# Patient Record
Sex: Male | Born: 1990 | Race: Black or African American | Hispanic: No | Marital: Single | Smoking: Never smoker
Health system: Southern US, Community
[De-identification: ages and names within clinical notes are randomized; demographics above are authoritative.]

## PROBLEM LIST (undated history)

## (undated) DIAGNOSIS — M1990 Unspecified osteoarthritis, unspecified site: Secondary | ICD-10-CM

## (undated) DIAGNOSIS — J45909 Unspecified asthma, uncomplicated: Secondary | ICD-10-CM

## (undated) DIAGNOSIS — S92309A Fracture of unspecified metatarsal bone(s), unspecified foot, initial encounter for closed fracture: Secondary | ICD-10-CM

## (undated) DIAGNOSIS — T8484XA Pain due to internal orthopedic prosthetic devices, implants and grafts, initial encounter: Secondary | ICD-10-CM

## (undated) HISTORY — PX: HERNIA REPAIR: SHX51

## (undated) HISTORY — PX: HAND SURGERY: SHX662

---

## 2012-10-16 DIAGNOSIS — S92309A Fracture of unspecified metatarsal bone(s), unspecified foot, initial encounter for closed fracture: Secondary | ICD-10-CM

## 2012-10-16 HISTORY — DX: Fracture of unspecified metatarsal bone(s), unspecified foot, initial encounter for closed fracture: S92.309A

## 2012-11-08 ENCOUNTER — Other Ambulatory Visit: Payer: Self-pay | Admitting: Orthopedic Surgery

## 2012-11-08 ENCOUNTER — Encounter (HOSPITAL_BASED_OUTPATIENT_CLINIC_OR_DEPARTMENT_OTHER): Payer: Self-pay | Admitting: *Deleted

## 2012-11-09 NOTE — H&P (Signed)
Ketan Veney is an 22 y.o. male.   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  The injury occurred this morning during drills.  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  The fracture is nondisplaced the little bit of lateral beaking.  He does not recall any pain in the metatarsal, prior to the inversion injury.  Past Medical History  Diagnosis Date  . Asthma     prn inhaler  . Metatarsal fracture 10/2012    left 5th    Past Surgical History  Procedure Laterality Date  . Hand surgery Right     boxer's fx.    History reviewed. No pertinent family history. Social History:  reports that he has never smoked. He has never used smokeless tobacco. He reports that he does not drink alcohol or use illicit drugs.  Allergies: No Known Allergies  No prescriptions prior to admission    No results found for this or any previous visit (from the past 48 hour(s)). No results found.  Review of Systems  Constitutional: Negative.   HENT: Negative.   Eyes: Negative.   Respiratory:       Hx of asthma  Cardiovascular: Negative.   Gastrointestinal: Negative.   Genitourinary: Negative.   Musculoskeletal: Negative.        Left foot pain  Skin: Negative.   Neurological: Negative.   Endo/Heme/Allergies: Negative.   Psychiatric/Behavioral: Negative.     Height 6\' 4"  (1.93 m), weight 130.636 kg (288 lb). Physical Exam  Constitutional: He is oriented to person, place, and time. He appears well-developed and well-nourished.  HENT:  Head: Normocephalic and atraumatic.  Eyes: Pupils are equal, round, and reactive to light.  Neck: Normal range of motion. Neck supple.  Respiratory: Effort normal and breath sounds normal.  Musculoskeletal: Normal range of motion.   Neurological: He is alert and oriented to person, place, and time. He has normal reflexes.  Skin: Skin is warm and dry.  Psychiatric: He has a normal mood and affect. His behavior is normal. Judgment and thought content normal.     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  Plan: The benefits of nonoperative versus operative treatment were discussed at length with the patient.  He would like to proceed with operative intervention to promote healing sooner than later.  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  He will talk to Agustin Cree today about getting surgery scheduled.  He has a Personal assistant and all see him back at the time of surgical intervention.  Ionia Schey R 11/09/2012, 5:12 PM

## 2012-11-12 ENCOUNTER — Ambulatory Visit (HOSPITAL_BASED_OUTPATIENT_CLINIC_OR_DEPARTMENT_OTHER)
Admission: RE | Admit: 2012-11-12 | Discharge: 2012-11-12 | Disposition: A | Discharge status: Home / Self Care | Payer: BC Managed Care – PPO | Source: Ambulatory Visit | Attending: Orthopedic Surgery | Admitting: Orthopedic Surgery

## 2012-11-12 ENCOUNTER — Encounter (HOSPITAL_BASED_OUTPATIENT_CLINIC_OR_DEPARTMENT_OTHER): Payer: Self-pay | Admitting: *Deleted

## 2012-11-12 ENCOUNTER — Ambulatory Visit (HOSPITAL_BASED_OUTPATIENT_CLINIC_OR_DEPARTMENT_OTHER): Payer: BC Managed Care – PPO | Admitting: Anesthesiology

## 2012-11-12 ENCOUNTER — Encounter (HOSPITAL_BASED_OUTPATIENT_CLINIC_OR_DEPARTMENT_OTHER): Payer: Self-pay | Admitting: Anesthesiology

## 2012-11-12 ENCOUNTER — Encounter (HOSPITAL_BASED_OUTPATIENT_CLINIC_OR_DEPARTMENT_OTHER)
Admission: RE | Disposition: A | Discharge status: Home / Self Care | Payer: Self-pay | Source: Ambulatory Visit | Attending: Orthopedic Surgery

## 2012-11-12 DIAGNOSIS — R011 Cardiac murmur, unspecified: Secondary | ICD-10-CM | POA: Insufficient documentation

## 2012-11-12 DIAGNOSIS — S92309A Fracture of unspecified metatarsal bone(s), unspecified foot, initial encounter for closed fracture: Secondary | ICD-10-CM | POA: Insufficient documentation

## 2012-11-12 DIAGNOSIS — E669 Obesity, unspecified: Secondary | ICD-10-CM | POA: Insufficient documentation

## 2012-11-12 DIAGNOSIS — Y998 Other external cause status: Secondary | ICD-10-CM | POA: Insufficient documentation

## 2012-11-12 DIAGNOSIS — Y929 Unspecified place or not applicable: Secondary | ICD-10-CM | POA: Insufficient documentation

## 2012-11-12 DIAGNOSIS — Y9361 Activity, american tackle football: Secondary | ICD-10-CM | POA: Insufficient documentation

## 2012-11-12 DIAGNOSIS — Z6835 Body mass index (BMI) 35.0-35.9, adult: Secondary | ICD-10-CM | POA: Insufficient documentation

## 2012-11-12 DIAGNOSIS — X58XXXA Exposure to other specified factors, initial encounter: Secondary | ICD-10-CM | POA: Insufficient documentation

## 2012-11-12 DIAGNOSIS — J45909 Unspecified asthma, uncomplicated: Secondary | ICD-10-CM | POA: Insufficient documentation

## 2012-11-12 DIAGNOSIS — S92302A Fracture of unspecified metatarsal bone(s), left foot, initial encounter for closed fracture: Secondary | ICD-10-CM

## 2012-11-12 HISTORY — DX: Fracture of unspecified metatarsal bone(s), unspecified foot, initial encounter for closed fracture: S92.309A

## 2012-11-12 HISTORY — DX: Unspecified asthma, uncomplicated: J45.909

## 2012-11-12 HISTORY — PX: ORIF TOE FRACTURE: SHX5032

## 2012-11-12 SURGERY — OPEN REDUCTION INTERNAL FIXATION (ORIF) METATARSAL (TOE) FRACTURE
Anesthesia: General | Site: Foot | Laterality: Left | Wound class: Clean

## 2012-11-12 MED ORDER — FENTANYL CITRATE 0.05 MG/ML IJ SOLN: 50.0000 ug | INTRAMUSCULAR | Status: DC | PRN

## 2012-11-12 MED ORDER — CHLORHEXIDINE GLUCONATE 4 % EX LIQD: 60.0000 mL | Freq: Once | CUTANEOUS | Status: DC

## 2012-11-12 MED ORDER — CEFAZOLIN SODIUM-DEXTROSE 2-3 GM-% IV SOLR: 2.0000 g | INTRAVENOUS | Status: DC

## 2012-11-12 MED ORDER — MIDAZOLAM HCL 5 MG/5ML IJ SOLN
INTRAMUSCULAR | Status: DC | PRN
  Administered 2012-11-12: 2 mg via INTRAVENOUS

## 2012-11-12 MED ORDER — OXYCODONE HCL 5 MG/5ML PO SOLN: 5.0000 mg | Freq: Once | ORAL | Status: AC | PRN

## 2012-11-12 MED ORDER — MIDAZOLAM HCL 2 MG/2ML IJ SOLN: 1.0000 mg | INTRAMUSCULAR | Status: DC | PRN

## 2012-11-12 MED ORDER — ONDANSETRON HCL 4 MG/2ML IJ SOLN
INTRAMUSCULAR | Status: DC | PRN
  Administered 2012-11-12: 4 mg via INTRAVENOUS

## 2012-11-12 MED ORDER — DEXAMETHASONE SODIUM PHOSPHATE 10 MG/ML IJ SOLN
INTRAMUSCULAR | Status: DC | PRN
  Administered 2012-11-12: 10 mg via INTRAVENOUS

## 2012-11-12 MED ORDER — MIDAZOLAM HCL 2 MG/2ML IJ SOLN: 0.5000 mg | Freq: Once | INTRAMUSCULAR | Status: DC | PRN

## 2012-11-12 MED ORDER — OXYCODONE HCL 5 MG PO TABS
5.0000 mg | ORAL_TABLET | Freq: Once | ORAL | Status: AC | PRN
  Administered 2012-11-12: 5 mg via ORAL

## 2012-11-12 MED ORDER — LIDOCAINE HCL (CARDIAC) 20 MG/ML IV SOLN
INTRAVENOUS | Status: DC | PRN
  Administered 2012-11-12: 40 mg via INTRAVENOUS

## 2012-11-12 MED ORDER — FENTANYL CITRATE 0.05 MG/ML IJ SOLN
INTRAMUSCULAR | Status: DC | PRN
  Administered 2012-11-12: 50 ug via INTRAVENOUS
  Administered 2012-11-12: 100 ug via INTRAVENOUS
  Administered 2012-11-12: 50 ug via INTRAVENOUS

## 2012-11-12 MED ORDER — KETOROLAC TROMETHAMINE 30 MG/ML IJ SOLN
INTRAMUSCULAR | Status: DC | PRN
  Administered 2012-11-12: 30 mg via INTRAVENOUS

## 2012-11-12 MED ORDER — PROPOFOL 10 MG/ML IV BOLUS
INTRAVENOUS | Status: DC | PRN
  Administered 2012-11-12: 300 mg via INTRAVENOUS

## 2012-11-12 MED ORDER — HYDROMORPHONE HCL PF 1 MG/ML IJ SOLN
0.2500 mg | INTRAMUSCULAR | Status: DC | PRN
  Administered 2012-11-12 (×4): 0.5 mg via INTRAVENOUS

## 2012-11-12 MED ORDER — BUPIVACAINE HCL (PF) 0.5 % IJ SOLN
INTRAMUSCULAR | Status: DC | PRN
  Administered 2012-11-12: 10 mL

## 2012-11-12 MED ORDER — MEPERIDINE HCL 25 MG/ML IJ SOLN: 6.2500 mg | INTRAMUSCULAR | Status: DC | PRN

## 2012-11-12 MED ORDER — PROMETHAZINE HCL 25 MG/ML IJ SOLN: 6.2500 mg | INTRAMUSCULAR | Status: DC | PRN

## 2012-11-12 MED ORDER — MIDAZOLAM HCL 2 MG/ML PO SYRP: 12.0000 mg | ORAL_SOLUTION | Freq: Once | ORAL | Status: DC | PRN

## 2012-11-12 MED ORDER — DEXTROSE-NACL 5-0.45 % IV SOLN: INTRAVENOUS | Status: DC

## 2012-11-12 MED ORDER — LACTATED RINGERS IV SOLN
INTRAVENOUS | Status: DC
  Administered 2012-11-12: 20 mL/h via INTRAVENOUS
  Administered 2012-11-12 (×2): via INTRAVENOUS

## 2012-11-12 SURGICAL SUPPLY — 59 items
BANDAGE ELASTIC 4 VELCRO ST LF (GAUZE/BANDAGES/DRESSINGS) ×2 IMPLANT
BIT DRILL CANN 3.2MM (BIT) ×1 IMPLANT
BLADE SURG 10 STRL SS (BLADE) ×2 IMPLANT
BLADE SURG 15 STRL LF DISP TIS (BLADE) IMPLANT
BLADE SURG 15 STRL SS (BLADE)
BNDG ESMARK 4X9 LF (GAUZE/BANDAGES/DRESSINGS) ×2 IMPLANT
CANISTER SUCTION 1200CC (MISCELLANEOUS) ×2 IMPLANT
CHLORAPREP W/TINT 26ML (MISCELLANEOUS) ×2 IMPLANT
COVER TABLE BACK 60X90 (DRAPES) ×2 IMPLANT
CUFF TOURNIQUET SINGLE 18IN (TOURNIQUET CUFF) IMPLANT
CUFF TOURNIQUET SINGLE 34IN LL (TOURNIQUET CUFF) IMPLANT
DECANTER SPIKE VIAL GLASS SM (MISCELLANEOUS) IMPLANT
DRAPE EXTREMITY T 121X128X90 (DRAPE) ×2 IMPLANT
DRAPE OEC MINIVIEW 54X84 (DRAPES) ×2 IMPLANT
DRAPE SURG 17X23 STRL (DRAPES) IMPLANT
DRAPE U 20/CS (DRAPES) ×2 IMPLANT
DRILL BIT CANN 3.2MM (BIT) ×1
DURA STEPPER LG (CAST SUPPLIES) IMPLANT
DURA STEPPER MED (CAST SUPPLIES) IMPLANT
DURA STEPPER SML (CAST SUPPLIES) IMPLANT
ELECT REM PT RETURN 9FT ADLT (ELECTROSURGICAL) ×2
ELECTRODE REM PT RTRN 9FT ADLT (ELECTROSURGICAL) ×1 IMPLANT
GAUZE SPONGE 4X4 16PLY XRAY LF (GAUZE/BANDAGES/DRESSINGS) IMPLANT
GAUZE XEROFORM 1X8 LF (GAUZE/BANDAGES/DRESSINGS) ×2 IMPLANT
GLOVE BIO SURGEON STRL SZ7 (GLOVE) IMPLANT
GLOVE BIO SURGEON STRL SZ7.5 (GLOVE) ×2 IMPLANT
GLOVE BIOGEL PI IND STRL 7.0 (GLOVE) IMPLANT
GLOVE BIOGEL PI IND STRL 8 (GLOVE) ×1 IMPLANT
GLOVE BIOGEL PI INDICATOR 7.0 (GLOVE)
GLOVE BIOGEL PI INDICATOR 8 (GLOVE) ×1
GOWN PREVENTION PLUS XLARGE (GOWN DISPOSABLE) ×4 IMPLANT
GUIDEWIRE THREADED 1.6 (WIRE) ×2 IMPLANT
NEEDLE HYPO 22GX1.5 SAFETY (NEEDLE) IMPLANT
NS IRRIG 1000ML POUR BTL (IV SOLUTION) IMPLANT
PACK BASIN DAY SURGERY FS (CUSTOM PROCEDURE TRAY) ×2 IMPLANT
PAD CAST 4YDX4 CTTN HI CHSV (CAST SUPPLIES) ×1 IMPLANT
PADDING CAST ABS 4INX4YD NS (CAST SUPPLIES) ×1
PADDING CAST ABS COTTON 4X4 ST (CAST SUPPLIES) ×1 IMPLANT
PADDING CAST COTTON 4X4 STRL (CAST SUPPLIES) ×1
PENCIL BUTTON HOLSTER BLD 10FT (ELECTRODE) ×2 IMPLANT
SCREW CANN 46MM (Screw) ×2 IMPLANT
SCREW CANN P THRD/60 4.5 (Screw) ×2 IMPLANT
SLEEVE SCD COMPRESS KNEE MED (MISCELLANEOUS) IMPLANT
SPLINT PLASTER CAST XFAST 3X15 (CAST SUPPLIES) IMPLANT
SPLINT PLASTER XTRA FASTSET 3X (CAST SUPPLIES)
SPONGE GAUZE 4X4 12PLY (GAUZE/BANDAGES/DRESSINGS) ×2 IMPLANT
SPONGE LAP 18X18 X RAY DECT (DISPOSABLE) IMPLANT
SUCTION FRAZIER TIP 10 FR DISP (SUCTIONS) ×2 IMPLANT
SUT ETHILON 3 0 PS 1 (SUTURE) ×2 IMPLANT
SUT ETHILON 4 0 PS 2 18 (SUTURE) IMPLANT
SUT TICRON 1 T 12 (SUTURE) IMPLANT
SUT VIC AB 2-0 SH 27 (SUTURE)
SUT VIC AB 2-0 SH 27XBRD (SUTURE) IMPLANT
SYR BULB 3OZ (MISCELLANEOUS) IMPLANT
SYR CONTROL 10ML LL (SYRINGE) IMPLANT
TOWEL OR 17X24 6PK STRL BLUE (TOWEL DISPOSABLE) ×2 IMPLANT
TUBE CONNECTING 20X1/4 (TUBING) ×2 IMPLANT
UNDERPAD 30X30 INCONTINENT (UNDERPADS AND DIAPERS) IMPLANT
YANKAUER SUCT BULB TIP NO VENT (SUCTIONS) IMPLANT

## 2012-11-12 NOTE — Interval H&P Note (Signed)
History and Physical Interval Note:  11/12/2012 11:36 AM  Jose Chambers  has presented today for surgery, with the diagnosis of LEFT 5TH METATARSAL FRACTURE   The various methods of treatment have been discussed with the patient and family. After consideration of risks, benefits and other options for treatment, the patient has consented to  Procedure(s): OPEN REDUCTION INTERNAL FIXATION (ORIF) LEFT 5TH METATARSAL (TOE) FRACTURE (Left) as a surgical intervention .  The patient's history has been reviewed, patient examined, no change in status, stable for surgery.  I have reviewed the patient's chart and labs.  Questions were answered to the patient's satisfaction.     Nestor Lewandowsky

## 2012-11-12 NOTE — Anesthesia Procedure Notes (Signed)
Procedure Name: LMA Insertion Date/Time: 11/12/2012 11:59 AM Performed by: Burna Cash Pre-anesthesia Checklist: Patient identified, Emergency Drugs available, Suction available and Patient being monitored Patient Re-evaluated:Patient Re-evaluated prior to inductionOxygen Delivery Method: Circle System Utilized Preoxygenation: Pre-oxygenation with 100% oxygen Intubation Type: IV induction Ventilation: Mask ventilation without difficulty LMA: LMA inserted LMA Size: 5.0 Number of attempts: 1 Airway Equipment and Method: bite block Placement Confirmation: positive ETCO2 Tube secured with: Tape Dental Injury: Teeth and Oropharynx as per pre-operative assessment

## 2012-11-12 NOTE — Anesthesia Preprocedure Evaluation (Addendum)
Anesthesia Evaluation  Patient identified by MRN, date of birth, ID band Patient awake    Reviewed: Allergy & Precautions, H&P , NPO status , Patient's Chart, lab work & pertinent test results  History of Anesthesia Complications Negative for: history of anesthetic complications  Airway Mallampati: I TM Distance: >3 FB Neck ROM: Full    Dental  (+) Teeth Intact and Dental Advisory Given   Pulmonary asthma ,  breath sounds clear to auscultation  Pulmonary exam normal       Cardiovascular negative cardio ROS  + Valvular Problems/Murmurs Rhythm:Regular Rate:Normal     Neuro/Psych negative neurological ROS     GI/Hepatic negative GI ROS, Neg liver ROS,   Endo/Other  Morbid obesity  Renal/GU negative Renal ROS     Musculoskeletal   Abdominal (+) + obese,   Peds  Hematology   Anesthesia Other Findings   Reproductive/Obstetrics                          Anesthesia Physical Anesthesia Plan  ASA: II  Anesthesia Plan: General   Post-op Pain Management:    Induction: Intravenous  Airway Management Planned: LMA  Additional Equipment:   Intra-op Plan:   Post-operative Plan:   Informed Consent: I have reviewed the patients History and Physical, chart, labs and discussed the procedure including the risks, benefits and alternatives for the proposed anesthesia with the patient or authorized representative who has indicated his/her understanding and acceptance.   Dental advisory given  Plan Discussed with: CRNA and Surgeon  Anesthesia Plan Comments: (Patient declines regional, plan routine monitors, GA- LMA OK)        Anesthesia Quick Evaluation

## 2012-11-12 NOTE — Transfer of Care (Signed)
Immediate Anesthesia Transfer of Care Note  Patient: Jose Chambers  Procedure(s) Performed: Procedure(s): OPEN REDUCTION INTERNAL FIXATION (ORIF) LEFT 5TH METATARSAL (TOE) FRACTURE (Left)  Patient Location: PACU  Anesthesia Type:General  Level of Consciousness: sedated  Airway & Oxygen Therapy: Patient Spontanous Breathing and Patient connected to face mask oxygen  Post-op Assessment: Report given to PACU RN and Post -op Vital signs reviewed and stable  Post vital signs: Reviewed and stable  Complications: No apparent anesthesia complications

## 2012-11-12 NOTE — Anesthesia Postprocedure Evaluation (Signed)
  Anesthesia Post-op Note  Patient: Jose Chambers  Procedure(s) Performed: Procedure(s): OPEN REDUCTION INTERNAL FIXATION (ORIF) LEFT 5TH METATARSAL (TOE) FRACTURE (Left)  Patient Location: PACU  Anesthesia Type:General  Level of Consciousness: awake, alert , oriented and patient cooperative  Airway and Oxygen Therapy: Patient Spontanous Breathing  Post-op Pain: mild  Post-op Assessment: Post-op Vital signs reviewed, Patient's Cardiovascular Status Stable, Respiratory Function Stable, Patent Airway, No signs of Nausea or vomiting and Pain level controlled  Post-op Vital Signs: Reviewed and stable  Complications: No apparent anesthesia complications

## 2012-11-12 NOTE — Op Note (Signed)
Pre Op Dx: Left fifth metatarsal Jones fracture  Post Op Dx: Same  Procedure: Percutaneous open reduction internal fixation of left fifth Jones fracture with a 4.5 mm x 60 mm cannulated screw  Surgeon: Nestor Lewandowsky, MD  Assistant: Dannielle Burn PA-C present for the entire case, and needed for positioning retraction and operation of the C-arm.  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 The fracture and is in the watershed zone and will benefit from compressive screw fixation appear  Procedure: Patient identified by armband, received preoperative antibiotics, and taken [facility] where the appropriate anesthetic monitors were attached and general LMA anesthesia induced with the patient in the supine position. Left lower [place] was prepped and draped in usual sterile fashion from the toes to the midcalf and a timeout procedure performed. Using a #15 blade a longitudinal incision was made 1/2 cm proximal to the head of the fifth metatarsal laterally and going posteriorly for 1 cm. A guide pin was then inserted under C-arm imaging control into the base of the fifth metatarsal and driven through the fracture site or some resistance was felt consistent with an acute on chronic stress fracture. The guide pin was then taken down the shaft of the fifth metatarsal to the distal metaphyseal flare and overreamed of the it with the 3.2 mm reamer from the cannulated screw set. We then placed in the tap along the guide pin and measured for a 60 mm x 4.5 mm cannulated screw which was inserted over the guidewire without difficulty with good firm fixation. Final C-arm images were then captured and printed. The  wound irrigated out normal saline solution and closed with 3-0 nylon suture. Dressing of Xerofoam, 4 x 4 dressing sponges, web roll Ace wrap and a Cam Walker boot was reapplied. He will be weightbearing as tolerated until I see him back in the office in approximately one week.

## 2012-11-13 ENCOUNTER — Encounter (HOSPITAL_BASED_OUTPATIENT_CLINIC_OR_DEPARTMENT_OTHER): Payer: Self-pay | Admitting: Orthopedic Surgery

## 2013-08-16 ENCOUNTER — Other Ambulatory Visit: Payer: Self-pay | Admitting: Orthopedic Surgery

## 2013-08-16 DIAGNOSIS — M79672 Pain in left foot: Secondary | ICD-10-CM

## 2013-08-19 ENCOUNTER — Ambulatory Visit
Admission: RE | Admit: 2013-08-19 | Discharge: 2013-08-19 | Disposition: A | Discharge status: Home / Self Care | Payer: BC Managed Care – PPO | Source: Ambulatory Visit | Attending: Orthopedic Surgery | Admitting: Orthopedic Surgery

## 2013-08-19 DIAGNOSIS — M79672 Pain in left foot: Secondary | ICD-10-CM

## 2013-12-24 ENCOUNTER — Other Ambulatory Visit: Payer: Self-pay | Admitting: Family Medicine

## 2013-12-24 ENCOUNTER — Ambulatory Visit
Admission: RE | Admit: 2013-12-24 | Discharge: 2013-12-24 | Disposition: A | Discharge status: Home / Self Care | Payer: BC Managed Care – PPO | Source: Ambulatory Visit | Attending: Family Medicine | Admitting: Family Medicine

## 2013-12-24 DIAGNOSIS — M79672 Pain in left foot: Secondary | ICD-10-CM

## 2014-05-07 ENCOUNTER — Other Ambulatory Visit: Payer: Self-pay | Admitting: Orthopedic Surgery

## 2014-05-07 DIAGNOSIS — M79672 Pain in left foot: Secondary | ICD-10-CM

## 2014-05-09 ENCOUNTER — Other Ambulatory Visit: Payer: No Typology Code available for payment source

## 2014-05-12 ENCOUNTER — Ambulatory Visit
Admission: RE | Admit: 2014-05-12 | Discharge: 2014-05-12 | Disposition: A | Discharge status: Home / Self Care | Payer: BLUE CROSS/BLUE SHIELD | Source: Ambulatory Visit | Attending: Orthopedic Surgery | Admitting: Orthopedic Surgery

## 2014-05-12 DIAGNOSIS — M79672 Pain in left foot: Secondary | ICD-10-CM

## 2014-05-20 ENCOUNTER — Other Ambulatory Visit: Payer: Self-pay | Admitting: Orthopedic Surgery

## 2014-05-21 ENCOUNTER — Encounter (HOSPITAL_BASED_OUTPATIENT_CLINIC_OR_DEPARTMENT_OTHER): Payer: Self-pay | Admitting: *Deleted

## 2014-05-27 NOTE — H&P (Signed)
Jose Chambers is an 24 y.o. male.    Chief Complaint:  Right Foot Pain  HPI: Patient returns today with continued pain in the left fifth metatarsal where he had a reinjury bent a 4.5 mm cannulated screw and developed and a crack in the bone.  A CT scan is been accomplished showing some early bridging bone.  He still has pain with every step however.  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  The fracture was initially fixed in July of 2014 and he did well until a reinjury in March of 2015.  We have been using a bone stimulator.  Past Medical History  Diagnosis Date  . Asthma     prn inhaler  . Metatarsal fracture 10/2012    left 5th  . Painful orthopaedic hardware     lt foot    Past Surgical History  Procedure Laterality Date  . Hand surgery Right     boxer's fx.  . Orif toe fracture Left 11/12/2012    Procedure: OPEN REDUCTION INTERNAL FIXATION (ORIF) LEFT 5TH METATARSAL (TOE) FRACTURE;  Surgeon: Nestor LewandowskyFrank J Rowan, MD;  Location: [facility];  Service: Orthopedics;  Laterality: Left;    History reviewed. No pertinent family history. Social History:  reports that he has never smoked. He has never used smokeless tobacco. He reports that he drinks alcohol. He reports that he does not use illicit drugs.  Allergies: No Known Allergies  No prescriptions prior to admission    No results found for this or any previous visit (from the past 48 hour(s)). No results found.  Review of Systems  Constitutional: Negative.   HENT: Negative.   Eyes: Negative.   Cardiovascular: Negative.   Gastrointestinal: Negative.   Genitourinary: Negative.   Skin: Negative.   Neurological: Negative.   Endo/Heme/Allergies: Negative.   Psychiatric/Behavioral: Negative.   All other systems reviewed and are negative.   Height 6\' 4"  (1.93 m), weight 129.275 kg (285 lb). Physical Exam  Constitutional: He is oriented to person, place, and time. He appears well-developed and  well-nourished.  HENT:  Head: Normocephalic and atraumatic.  Eyes: Pupils are equal, round, and reactive to light.  Neck: Normal range of motion. Neck supple.  Cardiovascular: Intact distal pulses.   Respiratory: Effort normal.  GI: Soft. Bowel sounds are normal.  Musculoskeletal: He exhibits tenderness.  Little if any objective swelling over the left fifth metatarsal.  He is neurovascularly intact there is no erythema, inflammation or sign of infection.    Neurological: He is alert and oriented to person, place, and time.  Skin: Skin is warm and dry.  Psychiatric: He has a normal mood and affect. His behavior is normal. Judgment and thought content normal.     Assessment/Plan Assess: The patient has a minimal amount of bridging bone across the refracture site and there is a bent 4.5 mm cannulated screw in place.  Plan: After discussing options as well as risks and benefits, we will proceed with removal of the bent screw.  Reaming of the bone with application of a 6.5 mm screw and local bone grafting to the fracture site.  He will be in a boot for a few months after surgery and then should be able to resume training.  We will get this arranged for him sooner than later.  Jose Chambers R 05/27/2014, 8:42 AM

## 2014-05-28 ENCOUNTER — Encounter (HOSPITAL_BASED_OUTPATIENT_CLINIC_OR_DEPARTMENT_OTHER)
Admission: RE | Disposition: A | Discharge status: Home / Self Care | Payer: Self-pay | Source: Ambulatory Visit | Attending: Orthopedic Surgery

## 2014-05-28 ENCOUNTER — Ambulatory Visit (HOSPITAL_BASED_OUTPATIENT_CLINIC_OR_DEPARTMENT_OTHER): Payer: BLUE CROSS/BLUE SHIELD | Admitting: Certified Registered"

## 2014-05-28 ENCOUNTER — Encounter (HOSPITAL_BASED_OUTPATIENT_CLINIC_OR_DEPARTMENT_OTHER): Payer: Self-pay | Admitting: Certified Registered"

## 2014-05-28 ENCOUNTER — Ambulatory Visit (HOSPITAL_BASED_OUTPATIENT_CLINIC_OR_DEPARTMENT_OTHER)
Admission: RE | Admit: 2014-05-28 | Discharge: 2014-05-28 | Disposition: A | Discharge status: Home / Self Care | Payer: BLUE CROSS/BLUE SHIELD | Source: Ambulatory Visit | Attending: Orthopedic Surgery | Admitting: Orthopedic Surgery

## 2014-05-28 DIAGNOSIS — S92302A Fracture of unspecified metatarsal bone(s), left foot, initial encounter for closed fracture: Secondary | ICD-10-CM

## 2014-05-28 DIAGNOSIS — Y838 Other surgical procedures as the cause of abnormal reaction of the patient, or of later complication, without mention of misadventure at the time of the procedure: Secondary | ICD-10-CM | POA: Insufficient documentation

## 2014-05-28 DIAGNOSIS — F1099 Alcohol use, unspecified with unspecified alcohol-induced disorder: Secondary | ICD-10-CM | POA: Diagnosis not present

## 2014-05-28 DIAGNOSIS — J45909 Unspecified asthma, uncomplicated: Secondary | ICD-10-CM | POA: Insufficient documentation

## 2014-05-28 DIAGNOSIS — S92352K Displaced fracture of fifth metatarsal bone, left foot, subsequent encounter for fracture with nonunion: Secondary | ICD-10-CM | POA: Diagnosis not present

## 2014-05-28 DIAGNOSIS — E669 Obesity, unspecified: Secondary | ICD-10-CM | POA: Diagnosis not present

## 2014-05-28 DIAGNOSIS — Z6835 Body mass index (BMI) 35.0-35.9, adult: Secondary | ICD-10-CM | POA: Insufficient documentation

## 2014-05-28 DIAGNOSIS — G8918 Other acute postprocedural pain: Secondary | ICD-10-CM | POA: Insufficient documentation

## 2014-05-28 HISTORY — PX: HARDWARE REMOVAL: SHX979

## 2014-05-28 HISTORY — PX: ORIF TOE FRACTURE: SHX5032

## 2014-05-28 HISTORY — DX: Pain due to internal orthopedic prosthetic devices, implants and grafts, initial encounter: T84.84XA

## 2014-05-28 LAB — POCT HEMOGLOBIN-HEMACUE: Hemoglobin: 15.9 g/dL (ref 13.0–17.0)

## 2014-05-28 SURGERY — REMOVAL, HARDWARE
Anesthesia: Regional | Site: Foot | Laterality: Left

## 2014-05-28 MED ORDER — DEXTROSE 5 % IV SOLN
3.0000 g | INTRAVENOUS | Status: AC
  Administered 2014-05-28: 3 g via INTRAVENOUS

## 2014-05-28 MED ORDER — METOCLOPRAMIDE HCL 5 MG/ML IJ SOLN: 5.0000 mg | Freq: Three times a day (TID) | INTRAMUSCULAR | Status: DC | PRN

## 2014-05-28 MED ORDER — CHLORHEXIDINE GLUCONATE 4 % EX LIQD
60.0000 mL | Freq: Once | CUTANEOUS | Status: DC
Start: 2014-05-28 — End: 2014-05-28

## 2014-05-28 MED ORDER — PROPOFOL 10 MG/ML IV BOLUS
INTRAVENOUS | Status: DC | PRN
  Administered 2014-05-28: 250 mg via INTRAVENOUS

## 2014-05-28 MED ORDER — BUPIVACAINE-EPINEPHRINE (PF) 0.5% -1:200000 IJ SOLN
INTRAMUSCULAR | Status: DC | PRN
  Administered 2014-05-28: 30 mL via PERINEURAL

## 2014-05-28 MED ORDER — DEXTROSE-NACL 5-0.45 % IV SOLN: INTRAVENOUS | Status: DC

## 2014-05-28 MED ORDER — FENTANYL CITRATE 0.05 MG/ML IJ SOLN
INTRAMUSCULAR | Status: AC
  Filled 2014-05-28: qty 6

## 2014-05-28 MED ORDER — ONDANSETRON HCL 4 MG PO TABS: 4.0000 mg | ORAL_TABLET | Freq: Four times a day (QID) | ORAL | Status: DC | PRN

## 2014-05-28 MED ORDER — MIDAZOLAM HCL 2 MG/2ML IJ SOLN
INTRAMUSCULAR | Status: AC
  Filled 2014-05-28: qty 2

## 2014-05-28 MED ORDER — ONDANSETRON HCL 4 MG/2ML IJ SOLN: 4.0000 mg | Freq: Four times a day (QID) | INTRAMUSCULAR | Status: DC | PRN

## 2014-05-28 MED ORDER — DEXAMETHASONE SODIUM PHOSPHATE 10 MG/ML IJ SOLN
INTRAMUSCULAR | Status: DC | PRN
  Administered 2014-05-28: 10 mg via INTRAVENOUS

## 2014-05-28 MED ORDER — LIDOCAINE HCL (CARDIAC) 20 MG/ML IV SOLN
INTRAVENOUS | Status: DC | PRN
  Administered 2014-05-28: 30 mg via INTRAVENOUS

## 2014-05-28 MED ORDER — OXYCODONE HCL 5 MG PO TABS: 5.0000 mg | ORAL_TABLET | Freq: Once | ORAL | Status: DC | PRN

## 2014-05-28 MED ORDER — FENTANYL CITRATE 0.05 MG/ML IJ SOLN
50.0000 ug | INTRAMUSCULAR | Status: DC | PRN
  Administered 2014-05-28: 100 ug via INTRAVENOUS

## 2014-05-28 MED ORDER — FENTANYL CITRATE 0.05 MG/ML IJ SOLN
INTRAMUSCULAR | Status: AC
  Filled 2014-05-28: qty 2

## 2014-05-28 MED ORDER — MIDAZOLAM HCL 2 MG/2ML IJ SOLN
1.0000 mg | INTRAMUSCULAR | Status: DC | PRN
  Administered 2014-05-28: 2 mg via INTRAVENOUS

## 2014-05-28 MED ORDER — METOCLOPRAMIDE HCL 5 MG PO TABS: 5.0000 mg | ORAL_TABLET | Freq: Three times a day (TID) | ORAL | Status: DC | PRN

## 2014-05-28 MED ORDER — OXYCODONE HCL 5 MG/5ML PO SOLN: 5.0000 mg | Freq: Once | ORAL | Status: DC | PRN

## 2014-05-28 MED ORDER — ONDANSETRON HCL 4 MG/2ML IJ SOLN
INTRAMUSCULAR | Status: DC | PRN
Start: 2014-05-28 — End: 2014-05-28
  Administered 2014-05-28: 4 mg via INTRAVENOUS

## 2014-05-28 MED ORDER — LACTATED RINGERS IV SOLN
INTRAVENOUS | Status: DC
  Administered 2014-05-28: 11:00:00 via INTRAVENOUS

## 2014-05-28 MED ORDER — HYDROCODONE-ACETAMINOPHEN 5-325 MG PO TABS: 1.0000 | ORAL_TABLET | Freq: Four times a day (QID) | ORAL | Status: DC | PRN

## 2014-05-28 MED ORDER — MIDAZOLAM HCL 5 MG/5ML IJ SOLN
INTRAMUSCULAR | Status: DC | PRN
  Administered 2014-05-28: 2 mg via INTRAVENOUS

## 2014-05-28 MED ORDER — CEFAZOLIN SODIUM 1-5 GM-% IV SOLN
INTRAVENOUS | Status: AC
  Filled 2014-05-28: qty 50

## 2014-05-28 MED ORDER — HYDROMORPHONE HCL 1 MG/ML IJ SOLN: 0.2500 mg | INTRAMUSCULAR | Status: DC | PRN

## 2014-05-28 MED ORDER — BUPIVACAINE HCL (PF) 0.5 % IJ SOLN
INTRAMUSCULAR | Status: AC
  Filled 2014-05-28: qty 30

## 2014-05-28 MED ORDER — CEFAZOLIN SODIUM-DEXTROSE 2-3 GM-% IV SOLR
INTRAVENOUS | Status: AC
  Filled 2014-05-28: qty 50

## 2014-05-28 SURGICAL SUPPLY — 78 items
BANDAGE ELASTIC 4 VELCRO ST LF (GAUZE/BANDAGES/DRESSINGS) ×6 IMPLANT
BANDAGE ELASTIC 6 VELCRO ST LF (GAUZE/BANDAGES/DRESSINGS) IMPLANT
BANDAGE ESMARK 6X9 LF (GAUZE/BANDAGES/DRESSINGS) IMPLANT
BLADE SURG 10 STRL SS (BLADE) ×3 IMPLANT
BLADE SURG 15 STRL LF DISP TIS (BLADE) ×2 IMPLANT
BLADE SURG 15 STRL SS (BLADE) ×4
BNDG ESMARK 4X9 LF (GAUZE/BANDAGES/DRESSINGS) ×3 IMPLANT
BNDG ESMARK 6X9 LF (GAUZE/BANDAGES/DRESSINGS)
BOOT STEPPER DURA LG (SOFTGOODS) IMPLANT
BOOT STEPPER DURA MED (SOFTGOODS) IMPLANT
BOOT STEPPER DURA SM (SOFTGOODS) IMPLANT
BOOT STEPPER DURA XLG (SOFTGOODS) ×3 IMPLANT
CANISTER SUCT 1200ML W/VALVE (MISCELLANEOUS) ×3 IMPLANT
COVER BACK TABLE 60X90IN (DRAPES) ×3 IMPLANT
CUFF TOURNIQUET SINGLE 18IN (TOURNIQUET CUFF) IMPLANT
CUFF TOURNIQUET SINGLE 34IN LL (TOURNIQUET CUFF) IMPLANT
DECANTER SPIKE VIAL GLASS SM (MISCELLANEOUS) IMPLANT
DRAPE EXTREMITY T 121X128X90 (DRAPE) ×3 IMPLANT
DRAPE OEC MINIVIEW 54X84 (DRAPES) ×3 IMPLANT
DRAPE SURG 17X23 STRL (DRAPES) ×3 IMPLANT
DRAPE U 20/CS (DRAPES) ×3 IMPLANT
DRAPE U-SHAPE 47X51 STRL (DRAPES) ×3 IMPLANT
DRAPE U-SHAPE 76X120 STRL (DRAPES) IMPLANT
DURAPREP 26ML APPLICATOR (WOUND CARE) ×3 IMPLANT
ELECT REM PT RETURN 9FT ADLT (ELECTROSURGICAL) ×3
ELECTRODE REM PT RTRN 9FT ADLT (ELECTROSURGICAL) ×1 IMPLANT
GAUZE SPONGE 4X4 12PLY STRL (GAUZE/BANDAGES/DRESSINGS) ×3 IMPLANT
GAUZE SPONGE 4X4 16PLY XRAY LF (GAUZE/BANDAGES/DRESSINGS) IMPLANT
GAUZE XEROFORM 1X8 LF (GAUZE/BANDAGES/DRESSINGS) ×3 IMPLANT
GLOVE BIO SURGEON STRL SZ 6.5 (GLOVE) ×2 IMPLANT
GLOVE BIO SURGEON STRL SZ7.5 (GLOVE) ×3 IMPLANT
GLOVE BIO SURGEON STRL SZ8.5 (GLOVE) ×3 IMPLANT
GLOVE BIO SURGEONS STRL SZ 6.5 (GLOVE) ×1
GLOVE BIOGEL PI IND STRL 7.0 (GLOVE) ×2 IMPLANT
GLOVE BIOGEL PI IND STRL 8 (GLOVE) ×1 IMPLANT
GLOVE BIOGEL PI IND STRL 9 (GLOVE) ×1 IMPLANT
GLOVE BIOGEL PI INDICATOR 7.0 (GLOVE) ×4
GLOVE BIOGEL PI INDICATOR 8 (GLOVE) ×2
GLOVE BIOGEL PI INDICATOR 9 (GLOVE) ×2
GOWN STRL REUS W/ TWL LRG LVL3 (GOWN DISPOSABLE) ×1 IMPLANT
GOWN STRL REUS W/ TWL XL LVL3 (GOWN DISPOSABLE) ×1 IMPLANT
GOWN STRL REUS W/TWL LRG LVL3 (GOWN DISPOSABLE) ×2
GOWN STRL REUS W/TWL XL LVL3 (GOWN DISPOSABLE) ×2
GUIDEWIRE THREADED 1.6 (WIRE) ×3 IMPLANT
GUIDEWIRE THREADED 2.8 (WIRE) ×3 IMPLANT
NEEDLE HYPO 22GX1.5 SAFETY (NEEDLE) IMPLANT
NS IRRIG 1000ML POUR BTL (IV SOLUTION) ×3 IMPLANT
PACK BASIN DAY SURGERY FS (CUSTOM PROCEDURE TRAY) ×3 IMPLANT
PAD CAST 4YDX4 CTTN HI CHSV (CAST SUPPLIES) ×1 IMPLANT
PADDING CAST ABS 4INX4YD NS (CAST SUPPLIES) ×2
PADDING CAST ABS COTTON 4X4 ST (CAST SUPPLIES) ×1 IMPLANT
PADDING CAST COTTON 4X4 STRL (CAST SUPPLIES) ×2
PADDING CAST COTTON 6X4 STRL (CAST SUPPLIES) IMPLANT
PENCIL BUTTON HOLSTER BLD 10FT (ELECTRODE) ×3 IMPLANT
SCREW CANN TI 16 THRD 6.5X50 (Screw) ×3 IMPLANT
SLEEVE SCD COMPRESS KNEE MED (MISCELLANEOUS) IMPLANT
SPLINT PLASTER CAST XFAST 3X15 (CAST SUPPLIES) IMPLANT
SPLINT PLASTER XTRA FASTSET 3X (CAST SUPPLIES)
SPONGE LAP 18X18 X RAY DECT (DISPOSABLE) IMPLANT
SPONGE LAP 4X18 X RAY DECT (DISPOSABLE) IMPLANT
STAPLER VISISTAT (STAPLE) IMPLANT
STAPLER VISISTAT 35W (STAPLE) IMPLANT
SUCTION FRAZIER TIP 10 FR DISP (SUCTIONS) ×3 IMPLANT
SUT ETHILON 3 0 PS 1 (SUTURE) IMPLANT
SUT ETHILON 4 0 PS 2 18 (SUTURE) ×3 IMPLANT
SUT MNCRL AB 3-0 PS2 18 (SUTURE) IMPLANT
SUT TICRON 1 T 12 (SUTURE) IMPLANT
SUT VIC AB 2-0 SH 27 (SUTURE)
SUT VIC AB 2-0 SH 27XBRD (SUTURE) IMPLANT
SUT VIC AB 3-0 SH 27 (SUTURE)
SUT VIC AB 3-0 SH 27X BRD (SUTURE) IMPLANT
SYR BULB 3OZ (MISCELLANEOUS) ×3 IMPLANT
SYR CONTROL 10ML LL (SYRINGE) IMPLANT
TOWEL OR 17X24 6PK STRL BLUE (TOWEL DISPOSABLE) ×3 IMPLANT
TUBE CONNECTING 20'X1/4 (TUBING) ×1
TUBE CONNECTING 20X1/4 (TUBING) ×2 IMPLANT
UNDERPAD 30X30 INCONTINENT (UNDERPADS AND DIAPERS) ×3 IMPLANT
YANKAUER SUCT BULB TIP NO VENT (SUCTIONS) IMPLANT

## 2014-05-28 NOTE — Anesthesia Preprocedure Evaluation (Addendum)
Anesthesia Evaluation  Patient identified by MRN, date of birth, ID band Patient awake    Reviewed: Allergy & Precautions, NPO status , Patient's Chart, lab work & pertinent test results  Airway Mallampati: II   Neck ROM: full    Dental   Pulmonary asthma ,          Cardiovascular negative cardio ROS      Neuro/Psych    GI/Hepatic   Endo/Other  obese  Renal/GU      Musculoskeletal   Abdominal   Peds  Hematology   Anesthesia Other Findings   Reproductive/Obstetrics                            Anesthesia Physical Anesthesia Plan  ASA: II  Anesthesia Plan: General and Regional   Post-op Pain Management: MAC Combined w/ Regional for Post-op pain   Induction: Intravenous  Airway Management Planned: LMA  Additional Equipment:   Intra-op Plan:   Post-operative Plan:   Informed Consent: I have reviewed the patients History and Physical, chart, labs and discussed the procedure including the risks, benefits and alternatives for the proposed anesthesia with the patient or authorized representative who has indicated his/her understanding and acceptance.     Plan Discussed with: CRNA, Anesthesiologist and Surgeon  Anesthesia Plan Comments:        Anesthesia Quick Evaluation

## 2014-05-28 NOTE — Anesthesia Procedure Notes (Addendum)
Anesthesia Regional Block:  Popliteal block  Pre-Anesthetic Checklist: ,, timeout performed, Correct Patient, Correct Site, Correct Laterality, Correct Procedure, Correct Position, site marked, Risks and benefits discussed,  Surgical consent,  Pre-op evaluation,  At surgeon's request and post-op pain management  Laterality: Left  Prep: chloraprep       Needles:  Injection technique: Single-shot  Needle Type: Echogenic Stimulator Needle     Needle Length: 9cm 9 cm Needle Gauge: 21 and 21 G    Additional Needles:  Procedures: ultrasound guided (picture in chart) and nerve stimulator Popliteal block  Nerve Stimulator or Paresthesia:  Response: plantar flexion of foot, 0.45 mA,   Additional Responses:   Narrative:  Start time: 05/28/2014 11:07 AM End time: 05/28/2014 11:17 AM Injection made incrementally with aspirations every 5 mL.  Performed by: Personally  Anesthesiologist: HODIERNE, ADAM  Additional Notes: Functioning IV was confirmed and monitors were applied.  A 90mm 21ga Arrow echogenic stimulator needle was used. Sterile prep and drape,hand hygiene and sterile gloves were used.  Negative aspiration and negative test dose prior to incremental administration of local anesthetic. The patient tolerated the procedure well.  Ultrasound guidance: relevent anatomy identified, needle position confirmed, local anesthetic spread visualized around nerve(s), vascular puncture avoided.  Image printed for medical record.    Procedure Name: LMA Insertion Date/Time: 05/28/2014 11:58 AM Performed by: Nicloe Frontera Pre-anesthesia Checklist: Patient identified, Emergency Drugs available, Suction available and Patient being monitored Patient Re-evaluated:Patient Re-evaluated prior to inductionOxygen Delivery Method: Circle System Utilized Preoxygenation: Pre-oxygenation with 100% oxygen Intubation Type: IV induction Ventilation: Mask ventilation without difficulty LMA: LMA  inserted LMA Size: 5.0 Number of attempts: 1 Airway Equipment and Method: Bite block Placement Confirmation: positive ETCO2 Tube secured with: Tape Dental Injury: Teeth and Oropharynx as per pre-operative assessment

## 2014-05-28 NOTE — Transfer of Care (Signed)
Immediate Anesthesia Transfer of Care Note  Patient: Jose Chambers  Procedure(s) Performed: Procedure(s): LEFT FOOT REMOVAL OF 4.5 MM CANNULATED SCREW AND REPLACE WITH 6.5 MM/SMALL SCREW (Left) OPEN REDUCTION INTERNAL FIXATION (ORIF) METATARSAL (TOE) FRACTURE (Left)  Patient Location: PACU  Anesthesia Type:GA combined with regional for post-op pain  Level of Consciousness: awake, alert , oriented and patient cooperative  Airway & Oxygen Therapy: Patient Spontanous Breathing and Patient connected to face mask oxygen  Post-op Assessment: Report given to RN and Post -op Vital signs reviewed and stable  Post vital signs: Reviewed and stable  Last Vitals:  Filed Vitals:   05/28/14 1255  BP:   Pulse: 66  Temp:   Resp: 20    Complications: No apparent anesthesia complications

## 2014-05-28 NOTE — Interval H&P Note (Signed)
History and Physical Interval Note:  05/28/2014 11:44 AM  Jose Chambers  has presented today for surgery, with the diagnosis of left fifth metatarsal fracture/nonunion  The various methods of treatment have been discussed with the patient and family. After consideration of risks, benefits and other options for treatment, the patient has consented to  Procedure(s): LEFT FOOT REMOVAL OF 4.5 MM CANNULATED SCREW AND REPLACE WITH 6.5 MM/SMALL SCREW (Left) OPEN REDUCTION INTERNAL FIXATION (ORIF) METATARSAL (TOE) FRACTURE (Left) as a surgical intervention .  The patient's history has been reviewed, patient examined, no change in status, stable for surgery.  I have reviewed the patient's chart and labs.  Questions were answered to the patient's satisfaction.     Nestor LewandowskyOWAN,Rhett Najera J

## 2014-05-28 NOTE — Anesthesia Postprocedure Evaluation (Signed)
Anesthesia Post Note  Patient: Jose Chambers  Procedure(s) Performed: Procedure(s) (LRB): LEFT FOOT REMOVAL OF 4.5 MM CANNULATED SCREW AND REPLACE WITH 6.5 MM/SMALL SCREW (Left) OPEN REDUCTION INTERNAL FIXATION (ORIF) METATARSAL (TOE) FRACTURE (Left)  Anesthesia type: General  Patient location: PACU  Post pain: Pain level controlled and Adequate analgesia  Post assessment: Post-op Vital signs reviewed, Patient's Cardiovascular Status Stable, Respiratory Function Stable, Patent Airway and Pain level controlled  Last Vitals:  Filed Vitals:   05/28/14 1418  BP:   Pulse: 70  Temp:   Resp: 17    Post vital signs: Reviewed and stable  Level of consciousness: awake, alert  and oriented  Complications: No apparent anesthesia complications

## 2014-05-28 NOTE — Op Note (Signed)
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  Post Op Dx: Same  Procedure: Removal of band 4.5 mm x 60 mm cannulated screw from left fifth metatarsal recurrent Jones fracture and exchanged for a 6.5 mm x 50 mm partially threaded cannulated screw.  Surgeon: Nestor LewandowskyFrank J Linville Decarolis, MD  Assistant: Tomi LikensEric K. Gaylene BrooksPhillips PA-C  (present throughout entire procedure and necessary for timely completion of the procedure)  Anesthesia: General  EBL: Minimal  Fluids: Cc  Tourniquet Time: None  Indications: 24 year old [place] [employer] who first fractured his left fifth metatarsal at the metaphyseal diaphyseal junction in the summer of 2014 he underwent open reduction internal fixation in July 2014 when on the heel and did well until a reinjury that occurred in March 2015. He refractured and actually bent the screw. We attempted to treat him conservatively with immobilization and a bone stimulator and he did attain partial union by CT scan by January 2016 but still has some discomfort. Because of this he is taken for removal the 4.5 mm screw and exchanged for a 6.5 mm cannulated screw with reaming to effectively bone graft of the fracture site.  Procedure: Patient was identified by arm band and receive preoperative IV antibiotics. He was taken to operating room one at the day surgery center appropriate segment monitors were attached and general LMA anesthesia induced with the patient in the supine position. Tourniquet was applied to the left calf the left lower extremity prepped and draped in usual sterile fashion from the toes to the tourniquet. Timeout procedure was performed. We began the operation by re-creating the old 8 mm incision proximal to the fifth metatarsal and we were able to pass a guide pin through the 4.5 mm cannulated screw and then remove it with the cannulated screwdriver. We then placed a new guide pin from the 6.5 mm screw set into the canal of  the fifth metatarsal and overreamed with the 5 mm reamer. We then measured for a 50 mm partially threaded screw and drove it over the guidepin until we obtain good firm fixation on the proximal fifth metatarsal. Final C-arm images were taken the wound was irrigated out normal saline solution and closed with 3-0 nylon suture. A dressing of Xerofoam 4 x 4 dressing sponges web roll and Ace wrap applied. He was placed in a Cam Walker boot for postoperative immobilization. We will see him back in the office in approximately one week.

## 2014-05-28 NOTE — Progress Notes (Signed)
Assisted Dr. Hodierne with left, ultrasound guided, popliteal block. Side rails up, monitors on throughout procedure. See vital signs in flow sheet. Tolerated Procedure well. 

## 2014-05-28 NOTE — Discharge Instructions (Signed)
Regional Anesthesia Blocks ° °1. Numbness or the inability to move the "blocked" extremity may last from 3-48 hours after placement. The length of time depends on the medication injected and your individual response to the medication. If the numbness is not going away after 48 hours, call your surgeon. ° °2. The extremity that is blocked will need to be protected until the numbness is gone and the  Strength has returned. Because you cannot feel it, you will need to take extra care to avoid injury. Because it may be weak, you may have difficulty moving it or using it. You may not know what position it is in without looking at it while the block is in effect. ° °3. For blocks in the legs and feet, returning to weight bearing and walking needs to be done carefully. You will need to wait until the numbness is entirely gone and the strength has returned. You should be able to move your leg and foot normally before you try and bear weight or walk. You will need someone to be with you when you first try to ensure you do not fall and possibly risk injury. ° °4. Bruising and tenderness at the needle site are common side effects and will resolve in a few days. ° °5. Persistent numbness or new problems with movement should be communicated to the surgeon or the [facility] ([phone])/ [facility] ([phone]). ° ° ° °Post Anesthesia Home Care Instructions ° °Activity: °Get plenty of rest for the remainder of the day. A responsible adult should stay with you for 24 hours following the procedure.  °For the next 24 hours, DO NOT: °-Drive a car °-Operate machinery °-Drink alcoholic beverages °-Take any medication unless instructed by your physician °-Make any legal decisions or sign important papers. ° °Meals: °Start with liquid foods such as gelatin or soup. Progress to regular foods as tolerated. Avoid greasy, spicy, heavy foods. If nausea and/or vomiting occur, drink only clear liquids until the  nausea and/or vomiting subsides. Call your physician if vomiting continues. ° °Special Instructions/Symptoms: °Your throat may feel dry or sore from the anesthesia or the breathing tube placed in your throat during surgery. If this causes discomfort, gargle with warm salt water. The discomfort should disappear within 24 hours. ° °

## 2014-05-29 ENCOUNTER — Encounter (HOSPITAL_BASED_OUTPATIENT_CLINIC_OR_DEPARTMENT_OTHER): Payer: Self-pay | Admitting: Orthopedic Surgery

## 2016-04-18 HISTORY — PX: ROTATOR CUFF REPAIR: SHX139

## 2018-05-18 ENCOUNTER — Ambulatory Visit
Admission: RE | Admit: 2018-05-18 | Discharge: 2018-05-18 | Disposition: A | Discharge status: Home / Self Care | Payer: Self-pay | Source: Ambulatory Visit | Attending: Nurse Practitioner | Admitting: Nurse Practitioner

## 2018-05-18 ENCOUNTER — Other Ambulatory Visit: Payer: Self-pay | Admitting: Nurse Practitioner

## 2018-05-18 DIAGNOSIS — Z021 Encounter for pre-employment examination: Secondary | ICD-10-CM

## 2018-08-13 ENCOUNTER — Emergency Department (HOSPITAL_COMMUNITY)
Admission: EM | Admit: 2018-08-13 | Discharge: 2018-08-13 | Disposition: A | Discharge status: Home / Self Care | Payer: 59 | Attending: Emergency Medicine | Admitting: Emergency Medicine

## 2018-08-13 ENCOUNTER — Other Ambulatory Visit: Payer: Self-pay

## 2018-08-13 ENCOUNTER — Encounter (HOSPITAL_COMMUNITY): Payer: Self-pay | Admitting: Emergency Medicine

## 2018-08-13 DIAGNOSIS — J4520 Mild intermittent asthma, uncomplicated: Secondary | ICD-10-CM | POA: Diagnosis not present

## 2018-08-13 DIAGNOSIS — R0602 Shortness of breath: Secondary | ICD-10-CM | POA: Diagnosis present

## 2018-08-13 MED ORDER — PREDNISONE 20 MG PO TABS: 60.0000 mg | ORAL_TABLET | Freq: Once | ORAL | Status: DC

## 2018-08-13 MED ORDER — DEXAMETHASONE SODIUM PHOSPHATE 10 MG/ML IJ SOLN
10.0000 mg | Freq: Once | INTRAMUSCULAR | Status: DC
  Filled 2018-08-13: qty 1

## 2018-08-13 MED ORDER — ALBUTEROL SULFATE HFA 108 (90 BASE) MCG/ACT IN AERS
8.0000 | INHALATION_SPRAY | Freq: Once | RESPIRATORY_TRACT | Status: AC
  Administered 2018-08-13: 8 via RESPIRATORY_TRACT
  Filled 2018-08-13: qty 6.7

## 2018-08-13 MED ORDER — DEXAMETHASONE 4 MG PO TABS
10.0000 mg | ORAL_TABLET | Freq: Once | ORAL | Status: AC
  Administered 2018-08-13: 10 mg via ORAL
  Filled 2018-08-13: qty 3

## 2018-08-13 MED ORDER — AEROCHAMBER PLUS FLO-VU LARGE MISC
Status: AC
  Filled 2018-08-13: qty 1

## 2018-08-13 MED ORDER — AEROCHAMBER PLUS FLO-VU LARGE MISC
1.0000 | Freq: Once | Status: AC
  Administered 2018-08-13: 1

## 2018-08-13 NOTE — ED Triage Notes (Signed)
Pt reports he woke up this morning and felt as if his asthma was bad.  He states his chest feels heavy and he feels SOB just like a asthma attack.  "feels like I can't take a full breath."

## 2018-08-13 NOTE — ED Notes (Signed)
Pt stats he understands instructions and homed stable with steady gait.

## 2018-08-13 NOTE — ED Provider Notes (Signed)
[facility] EMERGENCY DEPARTMENT Provider Note   CSN: [id number] Arrival date & time: 08/13/18  4696    History   Chief Complaint Chief Complaint  Patient presents with  . Asthma    HPI Jose Chambers is a 28 y.o. male w PMhx asthma, seasonal allergies, presenting to the ED with complaint of shortness of breath that began upon waking up this morning.  Patient states he felt his chest was tight and felt difficulty breathing.  He states this feels like his typical asthma related symptoms.  He had associated wheezing.  He did not have his albuterol inhaler at home, and therefore had to wait until he arrived at work to use his albuterol inhaler.  He used it at work and then again on the way here with gradual improvement.  Patient states currently his symptoms are nearly resolved, however has some heaviness and some mild pain with inhaling.  He states the pain is very typical for when his asthma is acting up, is not any worse or different.  He endorses symptoms of seasonal allergies including sneezing, runny nose, itchy eyes.  He is not treating his allergies.  He does not have a PCP.  He denies fever, infectious symptoms.  No contact with COVID positive people.      The history is provided by the patient.    Past Medical History:  Diagnosis Date  . Asthma    prn inhaler  . Metatarsal fracture 10/2012   left 5th  . Painful orthopaedic hardware [facility])    lt foot    There are no active problems to display for this patient.   Past Surgical History:  Procedure Laterality Date  . HAND SURGERY Right    boxer's fx.  Marland Kitchen HARDWARE REMOVAL Left 05/28/2014   Procedure: LEFT FOOT REMOVAL OF 4.5 MM CANNULATED SCREW AND REPLACE WITH 6.5 MM/SMALL SCREW;  Surgeon: Nestor Lewandowsky, MD;  Location: [facility];  Service: Orthopedics;  Laterality: Left;  . ORIF TOE FRACTURE Left 11/12/2012   Procedure: OPEN REDUCTION INTERNAL FIXATION (ORIF) LEFT 5TH METATARSAL (TOE) FRACTURE;   Surgeon: Nestor Lewandowsky, MD;  Location: [facility];  Service: Orthopedics;  Laterality: Left;  . ORIF TOE FRACTURE Left 05/28/2014   Procedure: OPEN REDUCTION INTERNAL FIXATION (ORIF) METATARSAL (TOE) FRACTURE;  Surgeon: Nestor Lewandowsky, MD;  Location: [facility];  Service: Orthopedics;  Laterality: Left;        Home Medications    Prior to Admission medications   Medication Sig Start Date End Date Taking? Authorizing Provider  albuterol (PROVENTIL HFA;VENTOLIN HFA) 108 (90 BASE) MCG/ACT inhaler Inhale into the lungs every 6 (six) hours as needed for wheezing or shortness of breath.    [provider]  HYDROcodone-acetaminophen (NORCO) 5-325 MG per tablet Take 1 tablet by mouth every 6 (six) hours as needed. 05/28/14   Allena Katz, PA-C    Family History No family history on file.  Social History Social History   Tobacco Use  . Smoking status: Never Smoker  . Smokeless tobacco: Never Used  Substance Use Topics  . Alcohol use: Yes    Comment: social  . Drug use: No     Allergies   Patient has no known allergies.   Review of Systems Review of Systems  Constitutional: Negative for fever.  HENT: Positive for rhinorrhea and sneezing.   Respiratory: Positive for chest tightness and shortness of breath.   All other systems reviewed and  are negative.    Physical Exam Updated Vital Signs BP (!) 145/92 (BP Location: Right Arm)   Pulse 84   Temp 97.8 F (36.6 C) (Oral)   Resp (!) 26   Ht 6\' 3"  (1.905 m)   Wt (!) 154.2 kg   SpO2 96%   BMI 42.50 kg/m   Physical Exam Vitals signs and nursing note reviewed.  Constitutional:      General: He is not in acute distress.    Appearance: He is well-developed. He is obese. He is not ill-appearing.  HENT:     Head: Normocephalic and atraumatic.     Mouth/Throat:     Mouth: Mucous membranes are moist.     Pharynx: Oropharynx is clear.  Eyes:     Conjunctiva/sclera: Conjunctivae  normal.  Neck:     Musculoskeletal: Normal range of motion and neck supple.  Cardiovascular:     Rate and Rhythm: Normal rate and regular rhythm.  Pulmonary:     Effort: Pulmonary effort is normal. No respiratory distress.     Breath sounds: Normal breath sounds. No stridor. No wheezing, rhonchi or rales.     Comments: Speaking in full sentences Abdominal:     Palpations: Abdomen is soft.  Skin:    General: Skin is warm.  Neurological:     Mental Status: He is alert.  Psychiatric:        Behavior: Behavior normal.      ED Treatments / Results  Labs (all labs ordered are listed, but only abnormal results are displayed) Labs Reviewed - No data to display  EKG None  Radiology No results found.  Procedures Procedures (including critical care time)  Medications Ordered in ED Medications  albuterol (VENTOLIN HFA) 108 (90 Base) MCG/ACT inhaler 8 puff (has no administration in time range)  AeroChamber Plus Flo-Vu Large MISC 1 each (has no administration in time range)  dexamethasone (DECADRON) injection 10 mg (has no administration in time range)     Initial Impression / Assessment and Plan / ED Course  I have reviewed the triage vital signs and the nursing notes.  Pertinent labs & imaging results that were available during my care of the patient were reviewed by me and considered in my medical decision making (see chart for details).        Patient presenting with an episode of shortness of breath and chest tightness that began upon awakening this morning.  History of asthma, feels related to asthma, per patient.  Patient treated with albuterol x2 prior to arrival and feels near his baseline upon evaluation.  He states his chest feels still somewhat heavy, however he is no longer short of breath or wheezing.  On exam, vital signs are stable, normal work of breathing.  Speaking in full sentences.  Lungs are clear bilaterally. O2 sat >95% on RA. ENT exam is unremarkable.   Treated with albuterol and a dose of Decadron.  Pt breathing at baseline prior to discharge. Do not believe patient is currently having asthma exacerbation given reassuring exam and improvement with home use of albuterol.  Encourage patient begin using antihistamines for allergy symptoms.  Encourage he establish primary care.  Return precautions discussed.  Safe for discharge.  Discussed results, findings, treatment and follow up. Patient advised of return precautions. Patient verbalized understanding and agreed with plan.   Final Clinical Impressions(s) / ED Diagnoses   Final diagnoses:  Mild intermittent asthma, unspecified whether complicated    ED Discharge Orders  None       Akshay Spang, [place] N, [place] 08/13/18 6270    Maia Plan, MD 08/13/18 2025

## 2018-08-13 NOTE — ED Notes (Signed)
ED Provider at bedside. 

## 2018-08-13 NOTE — Discharge Instructions (Signed)
Please read instructions below. Use your albuterol inhaler every 4-6 hours as needed for shortness of breath or wheezing.  It is recommended that you establish a primary care provider. Return to the ER if you have shortness of breath not improved by your inhaler, or new or concerning symptoms.

## 2019-04-12 ENCOUNTER — Emergency Department (HOSPITAL_COMMUNITY)
Admission: EM | Admit: 2019-04-12 | Discharge: 2019-04-12 | Disposition: A | Discharge status: Home / Self Care | Payer: 59 | Attending: Emergency Medicine | Admitting: Emergency Medicine

## 2019-04-12 ENCOUNTER — Encounter (HOSPITAL_COMMUNITY): Payer: Self-pay | Admitting: Emergency Medicine

## 2019-04-12 ENCOUNTER — Other Ambulatory Visit: Payer: Self-pay

## 2019-04-12 ENCOUNTER — Emergency Department (HOSPITAL_COMMUNITY): Payer: 59

## 2019-04-12 DIAGNOSIS — J4521 Mild intermittent asthma with (acute) exacerbation: Secondary | ICD-10-CM | POA: Insufficient documentation

## 2019-04-12 DIAGNOSIS — R0602 Shortness of breath: Secondary | ICD-10-CM | POA: Diagnosis present

## 2019-04-12 MED ORDER — PREDNISONE 20 MG PO TABS: 40.0000 mg | ORAL_TABLET | Freq: Every day | ORAL | 0 refills | Status: DC

## 2019-04-12 MED ORDER — ALBUTEROL SULFATE HFA 108 (90 BASE) MCG/ACT IN AERS
2.0000 | INHALATION_SPRAY | Freq: Once | RESPIRATORY_TRACT | Status: AC
  Administered 2019-04-12: 2 via RESPIRATORY_TRACT
  Filled 2019-04-12: qty 6.7

## 2019-04-12 NOTE — ED Triage Notes (Signed)
Patient reports SOB with occasional dry cough onset Saturday , denies fever or chills .

## 2019-04-12 NOTE — ED Provider Notes (Signed)
[facility] Provider [facility]   CSN: [id number] Arrival date & time: 04/12/19  0025     History Chief Complaint  Patient presents with  . Shortness of Breath    Jose Chambers is a 28 y.o. male with a hx of asthma presents to the Emergency Department complaining of gradual, persistent, progressively worsening SOB onset 11:30pm. Pt reports he felt as if he couldn't take a deep breath.  Pt reports he was evaluated by EMS but was not given any medications.  Pt reports this felt like the beginning of his asthma exacerbation.  He did not have his inhaler at that time. Pt reports he was given an inhaler in triage which helped significantly.  Pt reports URI symptoms last week and was tested for COVID 1 week ago with a negative result.  He has been taking OTC meds for this.  He does report associated dry cough for the last week.  Pt denies fever, chills, headache, neck pain, chest pain, abd pain, N/V/D, weakness, dizziness. Pt reports he has asthma exacerbations during the winter.     The history is provided by the patient and medical records. No language interpreter was used.       Past Medical History:  Diagnosis Date  . Asthma    prn inhaler  . Metatarsal fracture 10/2012   left 5th  . Painful orthopaedic hardware [facility])    lt foot    There are no problems to display for this patient.   Past Surgical History:  Procedure Laterality Date  . HAND SURGERY Right    boxer's fx.  Marland Kitchen. HARDWARE REMOVAL Left 05/28/2014   Procedure: LEFT FOOT REMOVAL OF 4.5 MM CANNULATED SCREW AND REPLACE WITH 6.5 MM/SMALL SCREW;  Surgeon: Nestor LewandowskyFrank J Rowan, MD;  Location: [facility];  Service: Orthopedics;  Laterality: Left;  . ORIF TOE FRACTURE Left 11/12/2012   Procedure: OPEN REDUCTION INTERNAL FIXATION (ORIF) LEFT 5TH METATARSAL (TOE) FRACTURE;  Surgeon: Nestor LewandowskyFrank J Rowan, MD;  Location: [facility];  Service: Orthopedics;  Laterality: Left;  . ORIF TOE  FRACTURE Left 05/28/2014   Procedure: OPEN REDUCTION INTERNAL FIXATION (ORIF) METATARSAL (TOE) FRACTURE;  Surgeon: Nestor LewandowskyFrank J Rowan, MD;  Location: [facility];  Service: Orthopedics;  Laterality: Left;       No family history on file.  Social History   Tobacco Use  . Smoking status: Never Smoker  . Smokeless tobacco: Never Used  Substance Use Topics  . Alcohol use: Yes    Comment: social  . Drug use: No    Home Medications Prior to Admission medications   Medication Sig Start Date End Date Taking? Authorizing Provider  albuterol (PROVENTIL HFA;VENTOLIN HFA) 108 (90 BASE) MCG/ACT inhaler Inhale 1-2 puffs into the lungs every 6 (six) hours as needed for wheezing or shortness of breath.    Yes [provider]  HYDROcodone-acetaminophen (NORCO) 5-325 MG per tablet Take 1 tablet by mouth every 6 (six) hours as needed. Patient not taking: Reported on 04/12/2019 05/28/14   Allena KatzPhillips, Eric K, PA-C  predniSONE (DELTASONE) 20 MG tablet Take 2 tablets (40 mg total) by mouth daily. 04/12/19   Deseri Loss, Dahlia ClientHannah, PA-C    Allergies    Patient has no known allergies.  Review of Systems   Review of Systems  Constitutional: Negative for appetite change, chills, fatigue and fever.  HENT: Negative for congestion, ear discharge, ear pain, mouth sores, postnasal drip, rhinorrhea, sinus pressure and sore throat.  Eyes: Negative for visual disturbance.  Respiratory: Positive for cough, chest tightness and shortness of breath. Negative for wheezing and stridor.   Cardiovascular: Negative for chest pain, palpitations and leg swelling.  Gastrointestinal: Negative for abdominal pain, diarrhea, nausea and vomiting.  Genitourinary: Negative for dysuria, frequency, hematuria and urgency.  Musculoskeletal: Negative for arthralgias, back pain, myalgias and neck stiffness.  Skin: Negative for rash.  Neurological: Negative for syncope, light-headedness, numbness and headaches.    Hematological: Negative for adenopathy.  Psychiatric/Behavioral: The patient is not nervous/anxious.   All other systems reviewed and are negative.   Physical Exam Updated Vital Signs BP 140/87 (BP Location: Left Arm)   Pulse 89   Temp 98.3 F (36.8 C)   Resp 18   SpO2 96%   Physical Exam Vitals and nursing note reviewed.  Constitutional:      General: He is not in acute distress.    Appearance: He is not diaphoretic.  HENT:     Head: Normocephalic.  Eyes:     General: No scleral icterus.    Conjunctiva/sclera: Conjunctivae normal.  Cardiovascular:     Rate and Rhythm: Normal rate and regular rhythm.     Pulses: Normal pulses.          Radial pulses are 2+ on the right side and 2+ on the left side.  Pulmonary:     Effort: Pulmonary effort is normal. No tachypnea, accessory muscle usage, prolonged expiration, respiratory distress or retractions.     Breath sounds: Normal breath sounds. No stridor. No decreased breath sounds, wheezing, rhonchi or rales.     Comments: Equal chest rise. No increased work of breathing. Abdominal:     General: There is no distension.     Palpations: Abdomen is soft.     Tenderness: There is no abdominal tenderness. There is no guarding or rebound.  Musculoskeletal:     Cervical back: Normal range of motion.     Comments: Moves all extremities equally and without difficulty.  Skin:    General: Skin is warm and dry.     Capillary Refill: Capillary refill takes less than 2 seconds.  Neurological:     Mental Status: He is alert.     GCS: GCS eye subscore is 4. GCS verbal subscore is 5. GCS motor subscore is 6.     Comments: Speech is clear and goal oriented.  Psychiatric:        Mood and Affect: Mood normal.     ED Results / Procedures / Treatments   Labs (all labs ordered are listed, but only abnormal results are displayed) Labs Reviewed - No data to display  EKG EKG Interpretation  Date/Time:  Friday April 12 2019 00:39:03  EST Ventricular Rate:  74 PR Interval:  166 QRS Duration: 100 QT Interval:  368 QTC Calculation: 408 R Axis:   64 Text Interpretation: Normal sinus rhythm with sinus arrhythmia T wave abnormality, consider inferior ischemia Abnormal ECG When compared with ECG of 08/13/2018, T wave inversion Inferior leads is more pronounced Confirmed by Delora Fuel ([id number]) on 04/12/2019 3:02:02 AM   Radiology DG Chest 2 View  Result Date: 04/12/2019 CLINICAL DATA:  Shortness of breath EXAM: CHEST - 2 VIEW COMPARISON:  May 18, 2018 FINDINGS: The heart size and mediastinal contours are within normal limits. Both lungs are clear. The visualized skeletal structures are unremarkable. IMPRESSION: No active cardiopulmonary disease. Electronically Signed   By: Prudencio Pair M.D.   On: 04/12/2019 01:28    Procedures  Procedures (including critical care time)  Medications Ordered in ED Medications  albuterol (VENTOLIN HFA) 108 (90 Base) MCG/ACT inhaler 2 puff (2 puffs Inhalation Given 04/12/19 0046)    ED Course  I have reviewed the triage vital signs and the nursing notes.  Pertinent labs & imaging results that were available during my care of the patient were reviewed by me and considered in my medical decision making (see chart for details).    MDM Rules/Calculators/A&P                      Lowry Montoya was evaluated in Emergency Department on 04/12/2019 for the symptoms described in the history of present illness. He was evaluated in the context of the global COVID-19 pandemic, which necessitated consideration that the patient might be at risk for infection with the SARS-CoV-2 virus that causes COVID-19. Institutional protocols and algorithms that pertain to the evaluation of patients at risk for COVID-19 are in a state of rapid change based on information released by regulatory bodies including the CDC and federal and state organizations. These policies and algorithms were followed during the patient's  care in the ED.  Patient presents to the emergency department with dry cough and shortness of breath.  Symptoms similar to previous asthma exacerbations.  Recent URI and weather change.  Patient did not have his albuterol inhaler with him.  On arrival here to the emergency department he was given albuterol MDI with significant improvement.  Clear and equal breath sounds on my exam.  No hypoxia or tachycardia.  Negative Covid test 1 week ago and no worsening symptoms.  No fevers or chills.  No respiratory distress.  No sensory muscle usage.  Less likely to be Covid at this time and more likely to be asthma exacerbation.  Will give short burst of prednisone.  Discussed close follow-up with primary care and reasons to return to the emergency department. Final Clinical Impression(s) / ED Diagnoses Final diagnoses:  Mild intermittent asthma with exacerbation    Rx / DC Orders ED Discharge Orders         Ordered    predniSONE (DELTASONE) 20 MG tablet  Daily     04/12/19 0424           Eulogia Dismore, Boyd Kerbs 04/12/19 0427    Ward, Layla Maw, DO 04/12/19 [phone]

## 2019-04-12 NOTE — ED Notes (Signed)
Pt felt fine walking from waiting room to room 34. No pain, no SOB

## 2019-04-12 NOTE — Discharge Instructions (Addendum)
1. Medications: albuterol, prednisone, usual home medications °2. Treatment: rest, drink plenty of fluids, begin OTC antihistamine (Zyrtec or Claritin)  °3. Follow Up: Please followup with your primary doctor in 2-3 days for discussion of your diagnoses and further evaluation after today's visit; if you do not have a primary care doctor use the resource guide provided to find one; Please return to the ER for difficulty breathing, high fevers or worsening symptoms. ° °

## 2019-11-28 ENCOUNTER — Other Ambulatory Visit (HOSPITAL_COMMUNITY): Payer: Self-pay | Admitting: Internal Medicine

## 2019-11-28 DIAGNOSIS — J45909 Unspecified asthma, uncomplicated: Secondary | ICD-10-CM

## 2019-12-16 ENCOUNTER — Other Ambulatory Visit (HOSPITAL_COMMUNITY)
Admission: RE | Admit: 2019-12-16 | Discharge: 2019-12-16 | Disposition: A | Discharge status: Home / Self Care | Payer: 59 | Source: Ambulatory Visit | Attending: Neurological Surgery | Admitting: Neurological Surgery

## 2019-12-16 DIAGNOSIS — Z20822 Contact with and (suspected) exposure to covid-19: Secondary | ICD-10-CM | POA: Diagnosis not present

## 2019-12-16 DIAGNOSIS — Z01812 Encounter for preprocedural laboratory examination: Secondary | ICD-10-CM | POA: Insufficient documentation

## 2019-12-16 LAB — SARS CORONAVIRUS 2 (TAT 6-24 HRS): SARS Coronavirus 2: NEGATIVE

## 2019-12-18 ENCOUNTER — Ambulatory Visit (HOSPITAL_COMMUNITY)
Admission: RE | Admit: 2019-12-18 | Discharge: 2019-12-18 | Disposition: A | Discharge status: Home / Self Care | Payer: 59 | Source: Ambulatory Visit | Attending: Internal Medicine | Admitting: Internal Medicine

## 2019-12-18 ENCOUNTER — Other Ambulatory Visit: Payer: Self-pay

## 2019-12-18 DIAGNOSIS — J45909 Unspecified asthma, uncomplicated: Secondary | ICD-10-CM | POA: Diagnosis present

## 2019-12-18 LAB — PULMONARY FUNCTION TEST
DL/VA % pred: 114 %
DL/VA: 5.64 ml/min/mmHg/L
DLCO unc % pred: 91 %
DLCO unc: 35.08 ml/min/mmHg
FEF 25-75 Post: 3.89 L/sec
FEF 25-75 Pre: 4.12 L/sec
FEF2575-%Change-Post: -5 %
FEF2575-%Pred-Post: 80 %
FEF2575-%Pred-Pre: 85 %
FEV1-%Change-Post: 2 %
FEV1-%Pred-Post: 84 %
FEV1-%Pred-Pre: 83 %
FEV1-Post: 3.85 L
FEV1-Pre: 3.77 L
FEV1FVC-%Change-Post: 0 %
FEV1FVC-%Pred-Pre: 98 %
FEV6-%Change-Post: 3 %
FEV6-%Pred-Post: 86 %
FEV6-%Pred-Pre: 84 %
FEV6-Post: 4.65 L
FEV6-Pre: 4.5 L
FEV6FVC-%Change-Post: 0 %
FEV6FVC-%Pred-Post: 101 %
FEV6FVC-%Pred-Pre: 100 %
FVC-%Change-Post: 2 %
FVC-%Pred-Post: 85 %
FVC-%Pred-Pre: 83 %
FVC-Post: 4.66 L
FVC-Pre: 4.53 L
Post FEV1/FVC ratio: 83 %
Post FEV6/FVC ratio: 100 %
Pre FEV1/FVC ratio: 83 %
Pre FEV6/FVC Ratio: 100 %
RV % pred: 56 %
RV: 1.03 L
TLC % pred: 76 %
TLC: 6.04 L

## 2019-12-18 MED ORDER — ALBUTEROL SULFATE (2.5 MG/3ML) 0.083% IN NEBU
2.5000 mg | INHALATION_SOLUTION | Freq: Once | RESPIRATORY_TRACT | Status: AC
  Administered 2019-12-18: 2.5 mg via RESPIRATORY_TRACT

## 2020-03-21 ENCOUNTER — Encounter (HOSPITAL_COMMUNITY): Payer: Self-pay

## 2020-03-21 ENCOUNTER — Other Ambulatory Visit: Payer: Self-pay

## 2020-03-21 ENCOUNTER — Emergency Department (HOSPITAL_COMMUNITY)
Admission: EM | Admit: 2020-03-21 | Discharge: 2020-03-21 | Disposition: A | Discharge status: Home / Self Care | Payer: 59 | Attending: Emergency Medicine | Admitting: Emergency Medicine

## 2020-03-21 ENCOUNTER — Emergency Department (HOSPITAL_COMMUNITY): Payer: 59

## 2020-03-21 DIAGNOSIS — R079 Chest pain, unspecified: Secondary | ICD-10-CM | POA: Diagnosis not present

## 2020-03-21 DIAGNOSIS — R0602 Shortness of breath: Secondary | ICD-10-CM | POA: Diagnosis not present

## 2020-03-21 DIAGNOSIS — J45909 Unspecified asthma, uncomplicated: Secondary | ICD-10-CM | POA: Diagnosis not present

## 2020-03-21 LAB — BASIC METABOLIC PANEL
Anion gap: 10 (ref 5–15)
BUN: 9 mg/dL (ref 6–20)
CO2: 23 mmol/L (ref 22–32)
Calcium: 8.8 mg/dL — ABNORMAL LOW (ref 8.9–10.3)
Chloride: 106 mmol/L (ref 98–111)
Creatinine, Ser: 0.85 mg/dL (ref 0.61–1.24)
GFR, Estimated: >60 mL/min (ref >60)
Glucose, Bld: 90 mg/dL (ref 70–99)
Potassium: 3.7 mmol/L (ref 3.5–5.1)
Sodium: 139 mmol/L (ref 135–145)

## 2020-03-21 LAB — CBC
HCT: 46.1 % (ref 39.0–52.0)
Hemoglobin: 15.3 g/dL (ref 13.0–17.0)
MCH: 28.8 pg (ref 26.0–34.0)
MCHC: 33.2 g/dL (ref 30.0–36.0)
MCV: 86.8 fL (ref 80.0–100.0)
Platelets: 280 10*3/uL (ref 150–400)
RBC: 5.31 MIL/uL (ref 4.22–5.81)
RDW: 13.3 % (ref 11.5–15.5)
WBC: 5.4 10*3/uL (ref 4.0–10.5)
nRBC: 0 % (ref 0.0–0.2)

## 2020-03-21 LAB — D-DIMER, QUANTITATIVE: D-Dimer, Quant: 0.27 ug/mL-FEU (ref 0.00–0.50)

## 2020-03-21 LAB — TROPONIN I (HIGH SENSITIVITY): Troponin I (High Sensitivity): 4 ng/L (ref <18)

## 2020-03-21 MED ORDER — PREDNISONE 10 MG (21) PO TBPK: ORAL_TABLET | Freq: Every day | ORAL | 0 refills | Status: DC

## 2020-03-21 MED ORDER — PREDNISONE 20 MG PO TABS
60.0000 mg | ORAL_TABLET | Freq: Once | ORAL | Status: AC
  Administered 2020-03-21: 60 mg via ORAL
  Filled 2020-03-21: qty 3

## 2020-03-21 MED ORDER — KETOROLAC TROMETHAMINE 30 MG/ML IJ SOLN
30.0000 mg | Freq: Once | INTRAMUSCULAR | Status: DC
  Filled 2020-03-21: qty 1
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
## 2020-03-21 NOTE — ED Triage Notes (Signed)
Pt to ED by POV from home today. 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 Pt has a history of asthma, states the pain does get worse when he takes a deep breath. Arrives A+O, VSS, NADN. Respirations even and unlabored.

## 2020-03-21 NOTE — Discharge Instructions (Signed)
As we discussed, your work-up today was reassuring.  This may be related to your asthma.  We will plan to treat with prednisone.  As we discussed, closely monitor your symptoms and return the emergency department for any chest pain, difficulty breathing, abdominal pain, vomiting or any other worsening concerning symptoms.

## 2020-07-29 ENCOUNTER — Other Ambulatory Visit: Payer: Self-pay | Admitting: Internal Medicine

## 2020-07-29 DIAGNOSIS — R7989 Other specified abnormal findings of blood chemistry: Secondary | ICD-10-CM

## 2020-08-19 ENCOUNTER — Ambulatory Visit
Admission: RE | Admit: 2020-08-19 | Discharge: 2020-08-19 | Disposition: A | Discharge status: Home / Self Care | Payer: 59 | Source: Ambulatory Visit | Attending: Internal Medicine | Admitting: Internal Medicine

## 2020-08-19 DIAGNOSIS — R7989 Other specified abnormal findings of blood chemistry: Secondary | ICD-10-CM

## 2021-07-28 ENCOUNTER — Encounter (HOSPITAL_BASED_OUTPATIENT_CLINIC_OR_DEPARTMENT_OTHER): Payer: Self-pay | Admitting: Urology

## 2021-07-28 ENCOUNTER — Emergency Department (HOSPITAL_BASED_OUTPATIENT_CLINIC_OR_DEPARTMENT_OTHER): Payer: 59 | Admitting: Radiology

## 2021-07-28 ENCOUNTER — Emergency Department (HOSPITAL_BASED_OUTPATIENT_CLINIC_OR_DEPARTMENT_OTHER)
Admission: EM | Admit: 2021-07-28 | Discharge: 2021-07-28 | Disposition: A | Discharge status: Home / Self Care | Payer: 59 | Attending: Emergency Medicine | Admitting: Emergency Medicine

## 2021-07-28 ENCOUNTER — Other Ambulatory Visit: Payer: Self-pay

## 2021-07-28 DIAGNOSIS — J4530 Mild persistent asthma, uncomplicated: Secondary | ICD-10-CM | POA: Insufficient documentation

## 2021-07-28 DIAGNOSIS — I10 Essential (primary) hypertension: Secondary | ICD-10-CM | POA: Diagnosis not present

## 2021-07-28 DIAGNOSIS — R0602 Shortness of breath: Secondary | ICD-10-CM | POA: Diagnosis present

## 2021-07-28 DIAGNOSIS — R0789 Other chest pain: Secondary | ICD-10-CM

## 2021-07-28 LAB — CBC
HCT: 45.8 % (ref 39.0–52.0)
Hemoglobin: 15.1 g/dL (ref 13.0–17.0)
MCH: 27.9 pg (ref 26.0–34.0)
MCHC: 33 g/dL (ref 30.0–36.0)
MCV: 84.5 fL (ref 80.0–100.0)
Platelets: 273 10*3/uL (ref 150–400)
RBC: 5.42 MIL/uL (ref 4.22–5.81)
RDW: 13.7 % (ref 11.5–15.5)
WBC: 5.9 10*3/uL (ref 4.0–10.5)
nRBC: 0 % (ref 0.0–0.2)

## 2021-07-28 LAB — BASIC METABOLIC PANEL
Anion gap: 10 (ref 5–15)
BUN: 15 mg/dL (ref 6–20)
CO2: 25 mmol/L (ref 22–32)
Calcium: 9.8 mg/dL (ref 8.9–10.3)
Chloride: 103 mmol/L (ref 98–111)
Creatinine, Ser: 1.19 mg/dL (ref 0.61–1.24)
GFR, Estimated: >60 mL/min (ref >60)
Glucose, Bld: 94 mg/dL (ref 70–99)
Potassium: 3.9 mmol/L (ref 3.5–5.1)
Sodium: 138 mmol/L (ref 135–145)

## 2021-07-28 LAB — TROPONIN I (HIGH SENSITIVITY): Troponin I (High Sensitivity): 2 ng/L (ref <18)

## 2021-07-28 NOTE — ED Notes (Signed)
Pt verbalizes understanding of discharge instructions. Opportunity for questioning and answers were provided. Pt discharged from ED to home.   ? ?

## 2021-07-28 NOTE — Discharge Instructions (Addendum)
Continue follow-up with your primary care this Friday as discussed for holistic management and reevaluation. ? ?Try to reduce the amount of caffeine that you intake especially before workouts as discussed.  This may increase your heart rate and put you at cardiac risk. ? ?Be careful about the supplements that you take, as discussed they are not FDA recommended or approved, and may have significant side effects on the body. ? ?Continue using your asthma medication, the preventative daily and also with the albuterol inhaler as needed. ? ?Return to the ED for new or worsening symptoms as discussed. ?

## 2021-07-28 NOTE — ED Provider Notes (Signed)
?[facility] EMERGENCY DEPT ?Provider Note ? ? ?CSN: [id number] ?Arrival date & time: 07/28/21  2003 ? ?  ? ?History ? ?Chief Complaint  ?Patient presents with  ? Chest Pain  ? ? ?Jose Chambers is a 31 y.o. male with chief complaint of occasional shortness of breath.  This usually occurs when he consumes excessive amounts of caffeine and exercises together.  On Saturday, he had a recent episode of this that lasted no more than 2-3 minutes.  Today, patient took a preworkout mixed drink with about 350 mg of caffeine, proceeded to exercise, then took a postworkout recovery mix as well.  Patient noticed when he got home, he had a strange sensation in his chest and felt like he could not catch his breath.  Admits he did not take his inhaler today for after exercising.  This only lasted for about 10 minutes and relieved without intervention.  Denies any symptoms here in the emergency department.  Denies recent upper respiratory infection, fever, difficulty swallowing, stiff neck, abdominal pain, N/V, constipation, or diarrhea.  Denies numbness or tingling of the upper or lower extremities.  Denies palpitations, dizziness or lightheadedness.  No known cardiac history.  No history of coagulopathy disorders or previous VTE/DVTs. ? ?The history is provided by the patient and medical records.  ?Chest Pain ? ?  ? ?Home Medications ?Prior to Admission medications   ?Medication Sig Start Date End Date Taking? Authorizing Provider  ?albuterol (PROVENTIL HFA;VENTOLIN HFA) 108 (90 BASE) MCG/ACT inhaler Inhale 1-2 puffs into the lungs every 6 (six) hours as needed for wheezing or shortness of breath.     [provider]  ?   ? ?Allergies    ?Patient has no known allergies.   ? ?Review of Systems   ?Review of Systems  ?Cardiovascular:  Positive for chest pain (Chest wall pain).  ? ?Physical Exam ?Updated Vital Signs ?BP 134/81   Pulse 71   Temp 98.5 ?F (36.9 ?C) (Oral)   Resp 15   Ht 6\' 3"  (1.905 m)   Wt (!)  172.4 kg   SpO2 99%   BMI 47.50 kg/m?  ?Physical Exam ?Vitals and nursing note reviewed.  ?Constitutional:   ?   General: He is not in acute distress. ?   Appearance: He is well-developed. He is not ill-appearing or diaphoretic.  ?HENT:  ?   Head: Normocephalic and atraumatic.  ?Eyes:  ?   Conjunctiva/sclera: Conjunctivae normal.  ?Cardiovascular:  ?   Rate and Rhythm: Normal rate and regular rhythm.  ?   Pulses:     ?     Radial pulses are 2+ on the right side and 2+ on the left side.  ?     Dorsalis pedis pulses are 2+ on the right side and 2+ on the left side.  ?   Heart sounds: Normal heart sounds. No murmur heard. ?Pulmonary:  ?   Effort: Pulmonary effort is normal. No tachypnea, accessory muscle usage or respiratory distress.  ?   Breath sounds: Normal breath sounds. No decreased breath sounds or wheezing.  ?   Comments: Lungs CTAB, patient speaking/breathing without increased effort or difficulty ?Chest:  ?   Chest wall: Tenderness (Very mild) present. No lacerations, deformity, swelling, crepitus or edema.  ?Abdominal:  ?   Palpations: Abdomen is soft.  ?   Tenderness: There is no abdominal tenderness.  ?Musculoskeletal:     ?   General: No swelling.  ?   Cervical back: Neck supple.  ?  Right lower leg: No tenderness. No edema.  ?   Left lower leg: No tenderness. No edema.  ?Skin: ?   General: Skin is warm and dry.  ?   Capillary Refill: Capillary refill takes less than 2 seconds.  ?Neurological:  ?   General: No focal deficit present.  ?   Mental Status: He is alert and oriented to person, place, and time.  ?Psychiatric:     ?   Mood and Affect: Mood normal.  ? ? ?ED Results / Procedures / Treatments   ?Labs ?(all labs ordered are listed, but only abnormal results are displayed) ?Labs Reviewed  ?BASIC METABOLIC PANEL  ?CBC  ?TROPONIN I (HIGH SENSITIVITY)  ?TROPONIN I (HIGH SENSITIVITY)  ? ? ?EKG ?EKG Interpretation ? ?Date/Time:  Wednesday July 28 2021 20:11:43 EDT ?Ventricular Rate:  91 ?PR  Interval:  172 ?QRS Duration: 94 ?QT Interval:  340 ?QTC Calculation: 418 ?R Axis:   28 ?Text Interpretation: Normal sinus rhythm Nonspecific T wave abnormality Abnormal ECG When compared with ECG of 21-Mar-2020 19:15, PREVIOUS ECG IS PRESENT Confirmed by Ernie AvenaLawsing, James ([id number]) on 07/28/2021 10:16:25 PM ? ?Radiology ?DG Chest 2 View ? ?Result Date: 07/28/2021 ?CLINICAL DATA:  Chest pain and shortness of breath EXAM: CHEST - 2 VIEW COMPARISON:  03/22/2019 FINDINGS: The heart size and mediastinal contours are within normal limits. Both lungs are clear. The visualized skeletal structures are unremarkable. IMPRESSION: No active cardiopulmonary disease. Electronically Signed   By: Alcide CleverMark  Lukens M.D.   On: 07/28/2021 20:35   ? ?Procedures ?Procedures  ? ? ?Medications Ordered in ED ?Medications - No data to display ? ?ED Course/ Medical Decision Making/ A&P ?  ?                        ?Medical Decision Making ?Amount and/or Complexity of Data Reviewed ?Labs: ordered. ?Radiology: ordered. ? ? ?31 y.o. male presents to the ED for concern of Chest Pain.  This involves an extensive number of treatment options, and is a complaint that carries with it a high risk of complications and morbidity.  The emergent differential diagnosis prior to evaluation includes, but is not limited to: acute coronary syndrome, costochondritis, pneumonia, bronchitis, acute asthma exacerbation, GERD ? ?This is not an exhaustive differential.  ? ?Past Medical History / Co-morbidities / Social History: ?Seasonal allergies, excessive caffeine use, HTN, asthma, hyperlipidemia, hepatic steatosis ? ?Additional History:  ?Internal and external records from outside source obtained and reviewed including previous ED and primary care visits and notes ? ?Obtained pt's verbal consent to discuss any pertinent medical information with his significant other via telephone ? ?Physical Exam: ?Physical exam performed. The pertinent findings include: Lungs CTAB.  Pt appears  to be resting comfortable, communicating clearly and without difficulty, without audible wheeze or signs of acute distress.  Pt not tachycardic or tachypneic.  Oxygen saturation varies between 99-100% on room air.  Chest wall minimally tender to palpation.  Radial pulses and DPs 2+ bilaterally.  RRR w/o M/R/G.  Good capillary refill.  No leg edema or tenderness. ? ?Lab Tests: ?I ordered, and personally interpreted labs.  The pertinent results include:   ?CBC: unremarkable ?CMP/BMP: unremarkable ?Troponins: initial negative, pt deferred second lab draw ? ?Imaging Studies: ?I ordered imaging studies including CXR and EKG.  I independently visualized and interpreted said imaging.  Pertinent results include: ?CXR: no acute cardiopulmonary pathology ?EKG: normal sinus rhythm with nonspecific T wave abnormality ?I agree with the radiologist  interpretation. ? ?Medications: ?None ? ?ED Course/Disposition: ?Pt well-appearing on exam.  Resting comfortably in bed.  Presented with chest pain and mild episode of shortness of breath, which dissipated shortly after.  Lungs CTAB.  Pt exam not indicating respiratory distress or compromise.  No wheeze or suspicion of acute asthma exacerbation.  Imaging and history not suggestive of pneumothorax, pneumonia, or bronchitis.  Considered ordering a D-Dimer and second troponin, but pt was not interested in further bloodwork and pt presentation, physical exam, and history not suspicious for pulmonary embolism.  PERC and Well's score of 0.  Therefore these will be deferred at this time.  History and physical exam not suggestive of acute asthma exacerbation or GERD. ? ?Discussed results and physical exam findings with pt.  He is young and an overall healthy, active pt with a presentation that is not suspicious for ACS etiology.  No significant cardiac risk factors other than mild HTN and hyperlipidemia.  He has been seen by his primary care and in the ED a few times times for intermittent  episodes of chest pain and shortness of breath.  Admits consumption of high amounts of caffeine and strenuous activity occur close to symptom onset.  Usually consumes around 350mg  or more of caffeine pre-workout and takes

## 2021-07-28 NOTE — ED Triage Notes (Signed)
Pt reports generalized chest pain and shortness of breath that started approx 1 hr pta  ?States felt like I was going to pass out, denies any N/V  ?H/o asthma  ? ?

## 2021-07-28 NOTE — ED Notes (Signed)
Patient doesn't want to wait for 2nd troponin. MD notified. D/C papers received. ?

## 2021-08-30 ENCOUNTER — Encounter: Payer: Self-pay | Admitting: Pulmonary Disease

## 2021-08-30 ENCOUNTER — Ambulatory Visit (INDEPENDENT_AMBULATORY_CARE_PROVIDER_SITE_OTHER): Payer: 59 | Admitting: Pulmonary Disease

## 2021-08-30 VITALS — BP 126/78 | HR 91 | Temp 97.7°F | Ht 75.0 in | Wt 394.4 lb

## 2021-08-30 DIAGNOSIS — R0609 Other forms of dyspnea: Secondary | ICD-10-CM

## 2021-08-30 NOTE — Progress Notes (Signed)
? ?      ?Jose Chambers    [id number]    03-May-1990 ? ?Primary Care Physician:Skakle, Liane Comber, DO ? ?Referring Physician: Sueanne Margarita, DO ?[address] ?[place],  [place] [address] ? ?Chief complaint:   ?Patient seen for shortness of breath ? ?HPI: ? ?Patient seen with shortness of breath on exertion ?History of asthma-longstanding history ? ?Symptoms are controlled with Symbicort which he uses as needed ? ?Only needs albuterol about less than once a week ? ?The use of Symbicort as needed also discussed with him ? ?He describes increased shortness of breath with wearing a vest at work ? ?We looked at his old pulmonary function test from 2021 showing restrictive physiology ? ?No significant bronchodilator response ? ?Never smoker ? ?Works as a Engineer, structural ? ? ?Outpatient Encounter Medications as of 08/30/2021  ?Medication Sig  ? albuterol (PROVENTIL HFA;VENTOLIN HFA) 108 (90 BASE) MCG/ACT inhaler Inhale 1-2 puffs into the lungs every 6 (six) hours as needed for wheezing or shortness of breath.   ? loratadine (CLARITIN) 10 MG tablet Take 10 mg by mouth daily.  ? montelukast (SINGULAIR) 10 MG tablet Take 10 mg by mouth daily.  ? SYMBICORT 160-4.5 MCG/ACT inhaler Inhale 2 puffs into the lungs 2 (two) times daily.  ? [DISCONTINUED] predniSONE (DELTASONE) 20 MG tablet Take 40 mg by mouth daily. (Patient not taking: Reported on 08/30/2021)  ? ?No facility-administered encounter medications on file as of 08/30/2021.  ? ? ?Allergies as of 08/30/2021  ? (No Known Allergies)  ? ? ?Past Medical History:  ?Diagnosis Date  ? Asthma   ? prn inhaler  ? Metatarsal fracture 10/2012  ? left 5th  ? Painful orthopaedic hardware [facility])   ? lt foot  ? ? ?Past Surgical History:  ?Procedure Laterality Date  ? HAND SURGERY Right   ? boxer's fx.  ? HARDWARE REMOVAL Left 05/28/2014  ? Procedure: LEFT FOOT REMOVAL OF 4.5 MM CANNULATED SCREW AND REPLACE WITH 6.5 MM/SMALL SCREW;  Surgeon: Kerin Salen, MD;  Location: [place];   Service: Orthopedics;  Laterality: Left;  ? ORIF TOE FRACTURE Left 11/12/2012  ? Procedure: OPEN REDUCTION INTERNAL FIXATION (ORIF) LEFT 5TH METATARSAL (TOE) FRACTURE;  Surgeon: Kerin Salen, MD;  Location: [place];  Service: Orthopedics;  Laterality: Left;  ? ORIF TOE FRACTURE Left 05/28/2014  ? Procedure: OPEN REDUCTION INTERNAL FIXATION (ORIF) METATARSAL (TOE) FRACTURE;  Surgeon: Kerin Salen, MD;  Location: [place];  Service: Orthopedics;  Laterality: Left;  ? ? ?Family History  ?Problem Relation Age of Onset  ? Hyperlipidemia Mother   ? Hyperlipidemia Father   ? Asthma Father   ? ? ?Social History  ? ?Socioeconomic History  ? Marital status: Single  ?  Spouse name: Not on file  ? Number of children: Not on file  ? Years of education: Not on file  ? Highest education level: Not on file  ?Occupational History  ? Not on file  ?Tobacco Use  ? Smoking status: Never  ? Smokeless tobacco: Never  ?Substance and Sexual Activity  ? Alcohol use: Yes  ?  Comment: social  ? Drug use: No  ? Sexual activity: Not on file  ?Other Topics Concern  ? Not on file  ?Social History Narrative  ? Not on file  ? ?Social Determinants of Health  ? ?Financial Resource Strain: Not on file  ?Food Insecurity: Not on file  ?Transportation Needs: Not on file  ?Physical  Activity: Not on file  ?Stress: Not on file  ?Social Connections: Not on file  ?Intimate Partner Violence: Not on file  ? ? ?Review of Systems  ?Constitutional:  Negative for fatigue.  ?Respiratory:  Positive for shortness of breath.   ? ?Vitals:  ? 08/30/21 1142  ?BP: 126/78  ?Pulse: 91  ?Temp: 97.7 ?F (36.5 ?C)  ?SpO2: 98%  ? ? ? ?Physical Exam ?Constitutional:   ?   Appearance: He is obese.  ?HENT:  ?   Head: Normocephalic.  ?   Nose: Nose normal. No congestion.  ?   Mouth/Throat:  ?   Mouth: Mucous membranes are moist.  ?Cardiovascular:  ?   Rate and Rhythm: Regular rhythm.  ?   Pulses: Normal pulses.  ?   Heart sounds: No murmur heard. ?   No friction rub.  ?Pulmonary:  ?   Effort: No respiratory distress.  ?   Breath sounds: No stridor. No wheezing or rhonchi.  ?Musculoskeletal:  ?   Cervical back: No rigidity or tenderness.  ?Neurological:  ?   Mental Status: He is alert.  ?   Cranial Nerves: No cranial nerve deficit.  ? ? ? ?Data Reviewed: ?PFT from 2021 reviewed showing restrictive physiology with a TLC of about 76% ? ?Assessment:  ?History of asthma ? ?Shortness of breath on exertion ? ? ?Shortness of breath with restriction on his chest ? ?Symptoms appear to be stable with Symbicort at present ? ?Plan/Recommendations: ? ?May use Symbicort as needed ? ?May consider repeat pulmonary function test ? ?Weight loss efforts encouraged ? ?Graded activities as tolerated with focus on cardio fitness ? ?Note regarding chest restriction provided to patient ? ? ?Sherrilyn Rist MD ?[facility] ?08/30/2021, 12:15 PM ? ?CC: Sueanne Margarita, DO ? ? ?

## 2021-08-30 NOTE — Patient Instructions (Signed)
I will see you in about 6 months ? ?We can consider repeating a breathing study ? ?You may use your Symbicort as needed or continue twice a day ? ?Continue graded exercises ? ? ?Weight loss will help the restriction ?

## 2021-11-21 IMAGING — CR DG CHEST 2V
2 series · 2 of 2 positions shown · non-contrast
Comparison: May 18, 2018

CLINICAL DATA: Shortness of breath

EXAM:
CHEST - 2 VIEW

[chest pa]
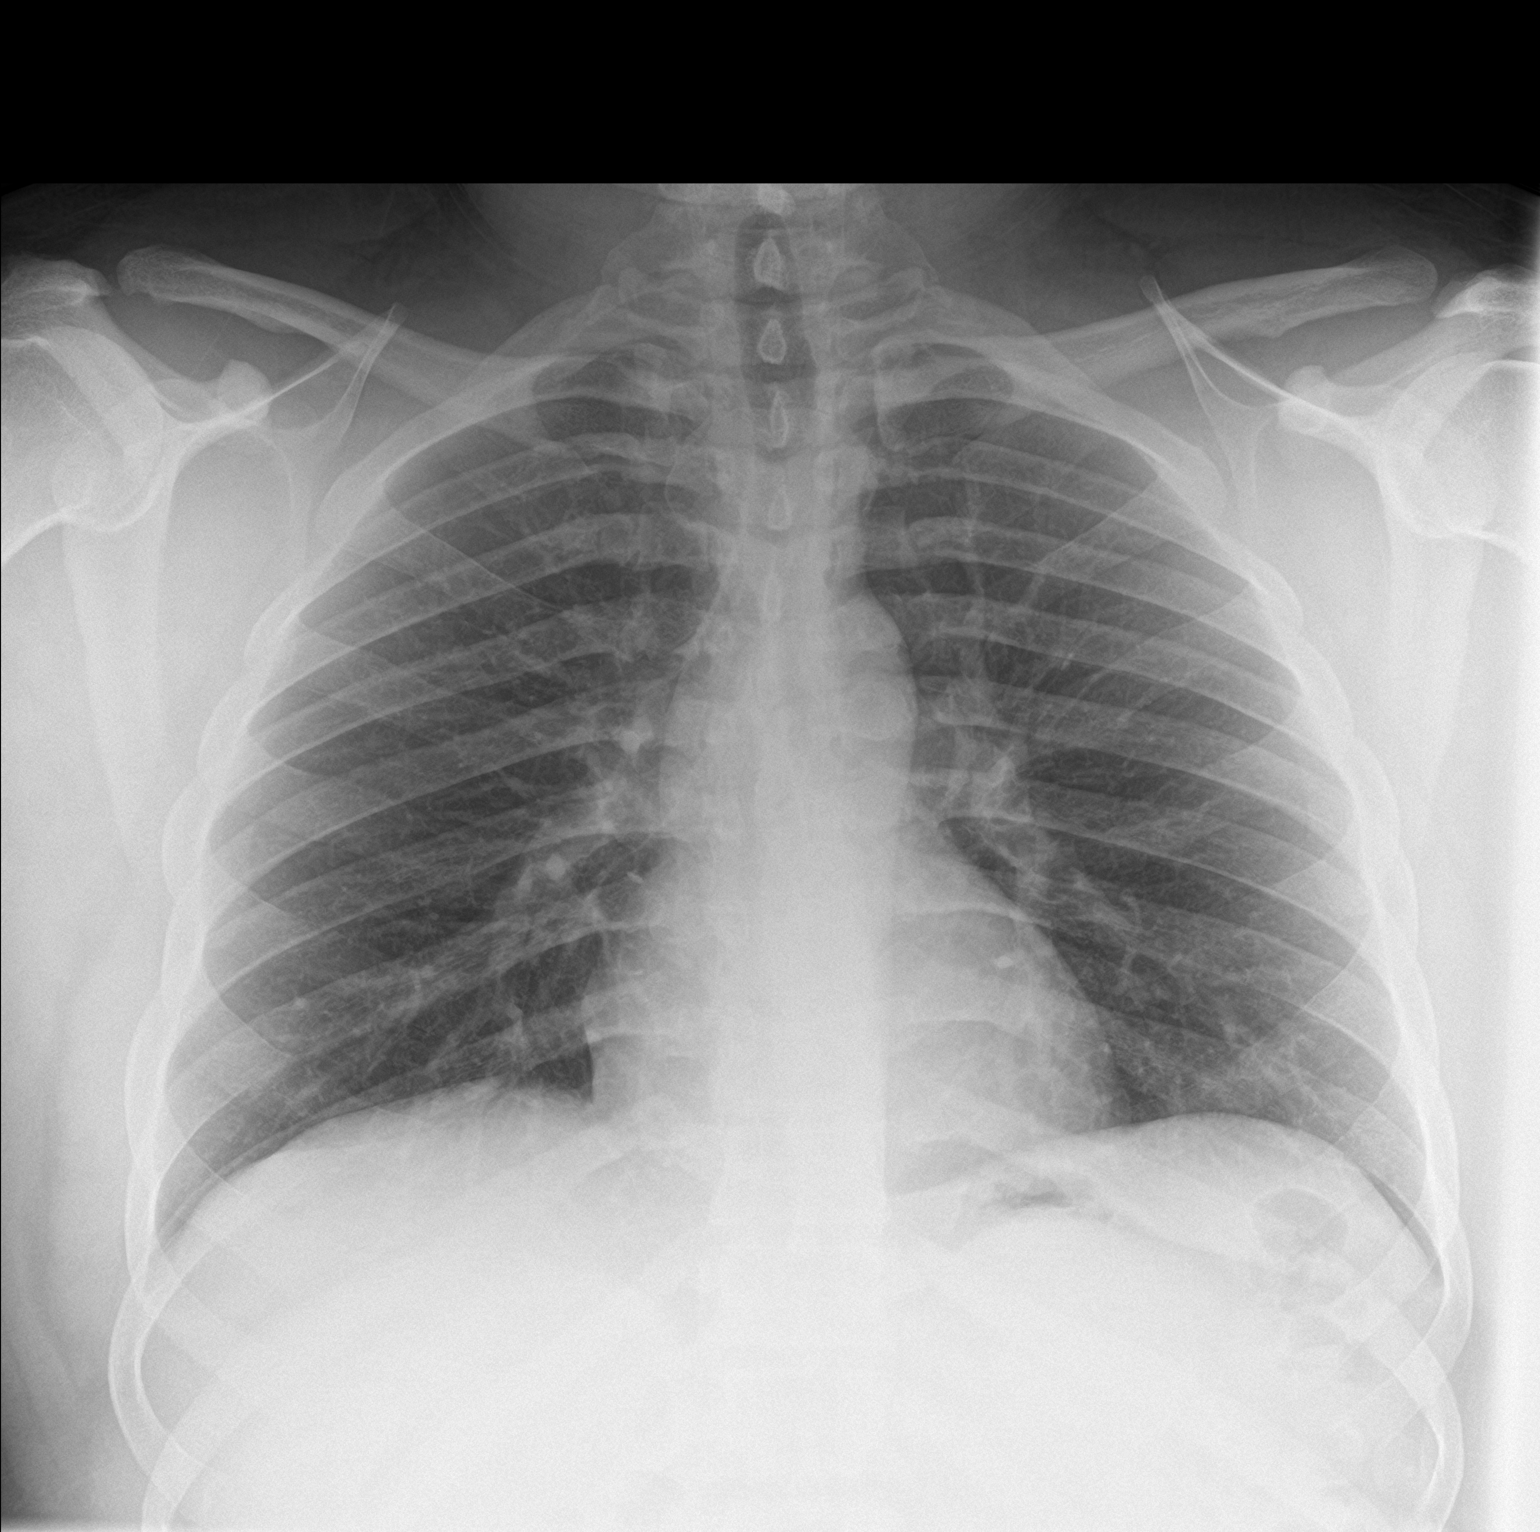

[chest lat]
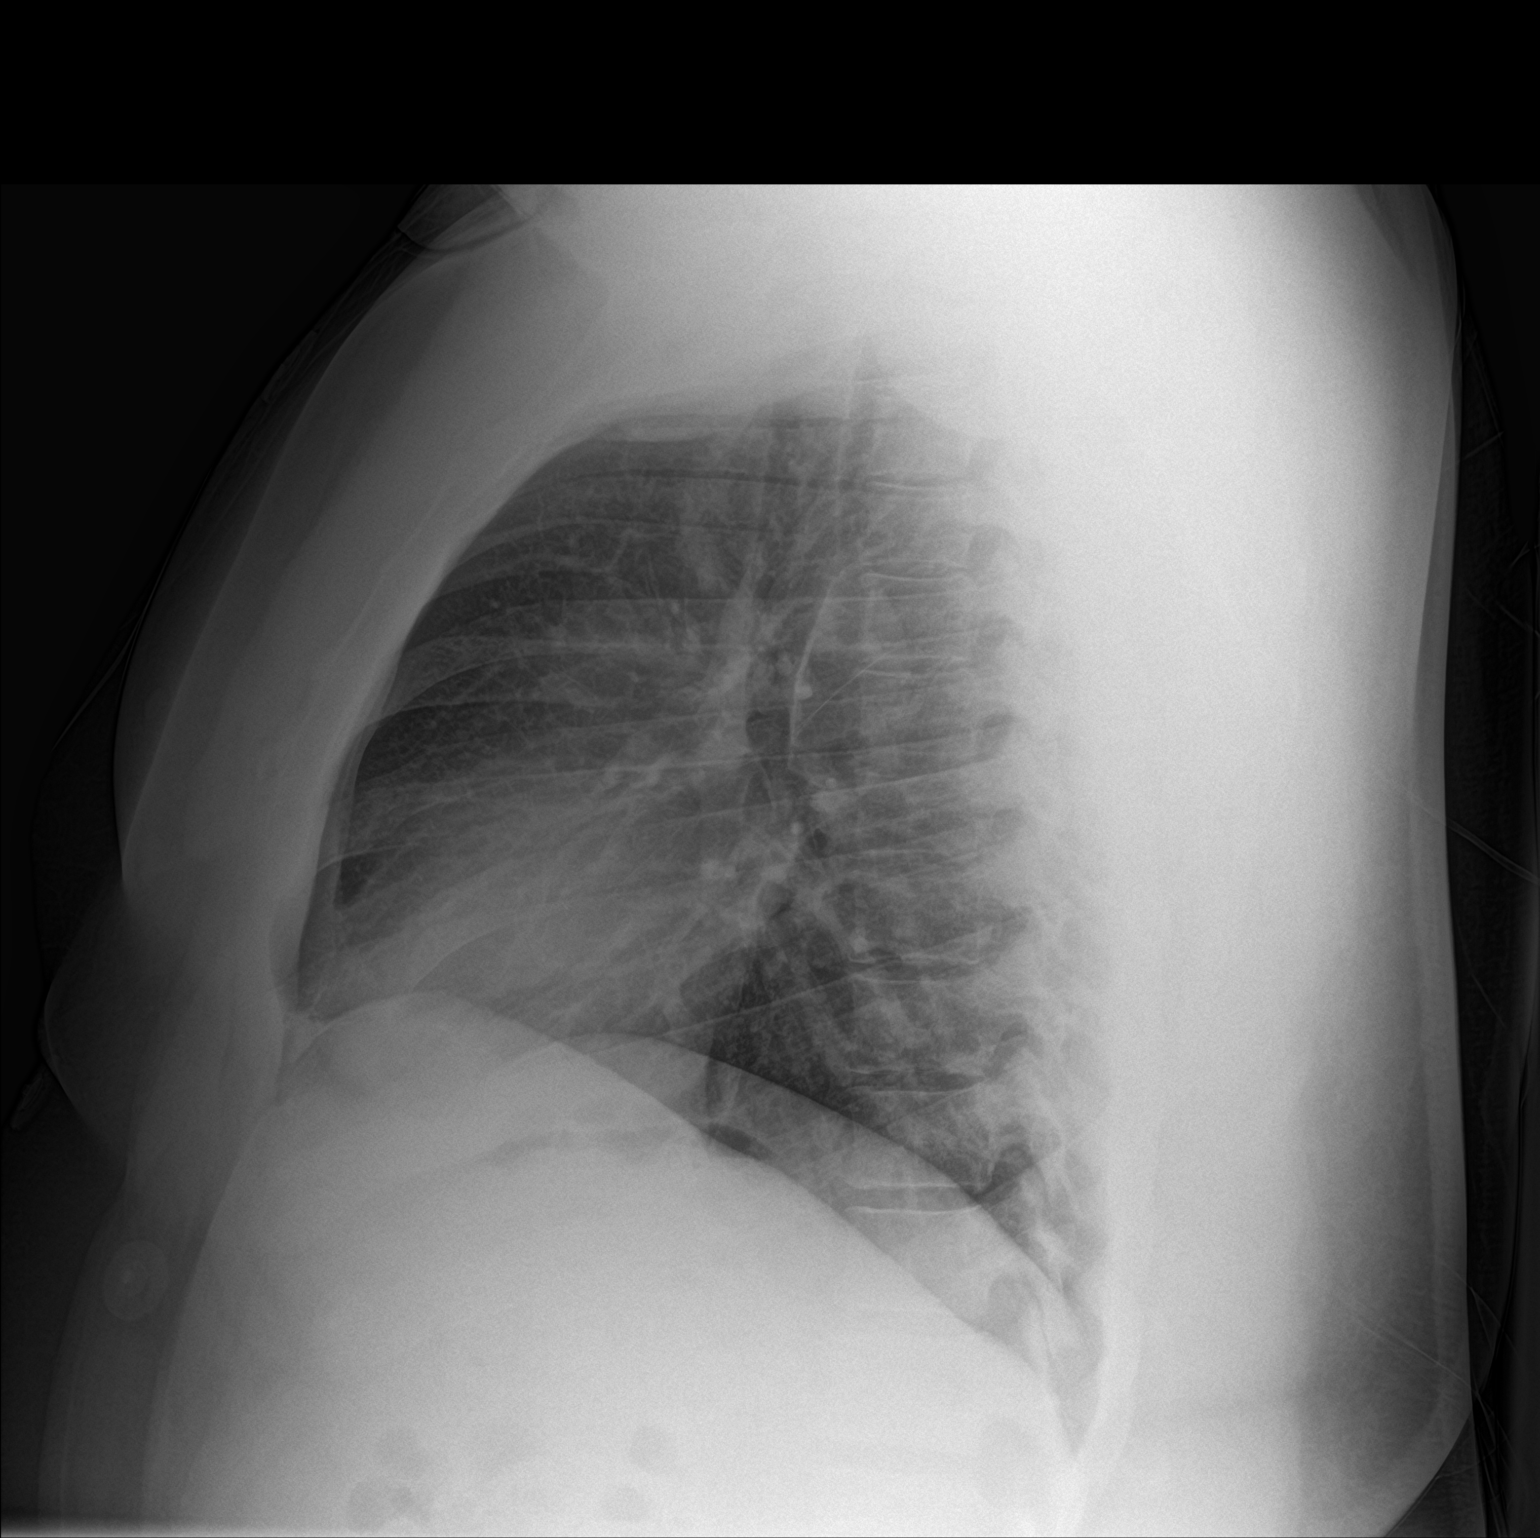

[2 of 2 positions shown; findings below may reference images not displayed]

FINDINGS: The heart size and mediastinal contours are within normal limits.
Both lungs are clear. The visualized skeletal structures are
unremarkable.
IMPRESSION: No active cardiopulmonary disease.

## 2022-07-03 ENCOUNTER — Other Ambulatory Visit: Payer: Self-pay

## 2022-07-03 ENCOUNTER — Emergency Department (HOSPITAL_BASED_OUTPATIENT_CLINIC_OR_DEPARTMENT_OTHER)
Admission: EM | Admit: 2022-07-03 | Discharge: 2022-07-03 | Disposition: A | Discharge status: Home / Self Care | Payer: 59 | Attending: Emergency Medicine | Admitting: Emergency Medicine

## 2022-07-03 ENCOUNTER — Emergency Department (HOSPITAL_BASED_OUTPATIENT_CLINIC_OR_DEPARTMENT_OTHER): Payer: 59

## 2022-07-03 ENCOUNTER — Encounter (HOSPITAL_BASED_OUTPATIENT_CLINIC_OR_DEPARTMENT_OTHER): Payer: Self-pay

## 2022-07-03 DIAGNOSIS — Z7951 Long term (current) use of inhaled steroids: Secondary | ICD-10-CM | POA: Insufficient documentation

## 2022-07-03 DIAGNOSIS — J45909 Unspecified asthma, uncomplicated: Secondary | ICD-10-CM | POA: Insufficient documentation

## 2022-07-03 DIAGNOSIS — K439 Ventral hernia without obstruction or gangrene: Secondary | ICD-10-CM | POA: Diagnosis not present

## 2022-07-03 DIAGNOSIS — R109 Unspecified abdominal pain: Secondary | ICD-10-CM | POA: Diagnosis present

## 2022-07-03 LAB — URINALYSIS, ROUTINE W REFLEX MICROSCOPIC
Bacteria, UA: NONE SEEN
Bilirubin Urine: NEGATIVE
Glucose, UA: NEGATIVE mg/dL
Hgb urine dipstick: NEGATIVE
Ketones, ur: NEGATIVE mg/dL
Leukocytes,Ua: NEGATIVE
Nitrite: NEGATIVE
Specific Gravity, Urine: >1.046 — ABNORMAL HIGH (ref 1.005–1.030)
pH: 6.5 (ref 5.0–8.0)

## 2022-07-03 LAB — CBC WITH DIFFERENTIAL/PLATELET
Abs Immature Granulocytes: 0.02 10*3/uL (ref 0.00–0.07)
Basophils Absolute: 0.1 10*3/uL (ref 0.0–0.1)
Basophils Relative: 1 %
Eosinophils Absolute: 0.3 10*3/uL (ref 0.0–0.5)
Eosinophils Relative: 5 %
HCT: 45.3 % (ref 39.0–52.0)
Hemoglobin: 15.2 g/dL (ref 13.0–17.0)
Immature Granulocytes: 0 %
Lymphocytes Relative: 48 %
Lymphs Abs: 2.7 10*3/uL (ref 0.7–4.0)
MCH: 28.3 pg (ref 26.0–34.0)
MCHC: 33.6 g/dL (ref 30.0–36.0)
MCV: 84.4 fL (ref 80.0–100.0)
Monocytes Absolute: 0.4 10*3/uL (ref 0.1–1.0)
Monocytes Relative: 8 %
Neutro Abs: 2.2 10*3/uL (ref 1.7–7.7)
Neutrophils Relative %: 38 %
Platelets: 275 10*3/uL (ref 150–400)
RBC: 5.37 MIL/uL (ref 4.22–5.81)
RDW: 13.5 % (ref 11.5–15.5)
WBC: 5.7 10*3/uL (ref 4.0–10.5)
nRBC: 0 % (ref 0.0–0.2)

## 2022-07-03 LAB — COMPREHENSIVE METABOLIC PANEL
ALT: 29 U/L (ref 0–44)
AST: 26 U/L (ref 15–41)
Albumin: 4 g/dL (ref 3.5–5.0)
Alkaline Phosphatase: 42 U/L (ref 38–126)
Anion gap: 9 (ref 5–15)
BUN: 12 mg/dL (ref 6–20)
CO2: 23 mmol/L (ref 22–32)
Calcium: 9.5 mg/dL (ref 8.9–10.3)
Chloride: 107 mmol/L (ref 98–111)
Creatinine, Ser: 1.02 mg/dL (ref 0.61–1.24)
GFR, Estimated: >60 mL/min (ref >60)
Glucose, Bld: 103 mg/dL — ABNORMAL HIGH (ref 70–99)
Potassium: 3.8 mmol/L (ref 3.5–5.1)
Sodium: 139 mmol/L (ref 135–145)
Total Bilirubin: 0.3 mg/dL (ref 0.3–1.2)
Total Protein: 7.3 g/dL (ref 6.5–8.1)

## 2022-07-03 LAB — LIPASE, BLOOD: Lipase: 40 U/L (ref 11–51)

## 2022-07-03 MED ORDER — IOHEXOL 300 MG/ML  SOLN
100.0000 mL | Freq: Once | INTRAMUSCULAR | Status: AC | PRN
  Administered 2022-07-03: 100 mL via INTRAVENOUS

## 2022-07-03 MED ORDER — FENTANYL CITRATE PF 50 MCG/ML IJ SOSY
50.0000 ug | PREFILLED_SYRINGE | Freq: Once | INTRAMUSCULAR | Status: DC
  Filled 2022-07-03: qty 1

## 2022-07-03 NOTE — ED Notes (Signed)
RN provided AVS using Teachback Method. Patient verbalizes understanding of Discharge Instructions. Opportunity for Questioning and Answers were provided by RN. Patient Discharged from ED ambulatory to Home via Self.  

## 2022-07-03 NOTE — ED Provider Notes (Signed)
Jose Chambers   CSN: [id number] Arrival date & time: 07/03/22  1858     History  Chief Complaint  Patient presents with   Abdominal Pain    Jose Chambers is a 32 y.o. male.   Abdominal Pain    Patient with medical history of asthma and ventral hernia presents the emergency department due to abdominal pain.  Patient states she has been dealing with ventral hernia off and on for many years, he has been following with general surgery but has not had repair done as he is trying to lose weight before any intervention.  Yesterday the pain became constant around 1, it was hard and difficult to reduce.  It is improved somewhat today but is still painful, denies any nausea or vomiting.  No change in bowel habits.  No other abdominal surgeries.  Home Medications Prior to Admission medications   Medication Sig Start Date End Date Taking? Authorizing Provider  albuterol (PROVENTIL HFA;VENTOLIN HFA) 108 (90 BASE) MCG/ACT inhaler Inhale 1-2 puffs into the lungs every 6 (six) hours as needed for wheezing or shortness of breath.     [provider]  loratadine (CLARITIN) 10 MG tablet Take 10 mg by mouth daily. 07/30/21   [provider]  montelukast (SINGULAIR) 10 MG tablet Take 10 mg by mouth daily. 08/23/21   [provider]  SYMBICORT 160-4.5 MCG/ACT inhaler Inhale 2 puffs into the lungs 2 (two) times daily. 07/30/21   [provider]      Allergies    Patient has no known allergies.    Review of Systems   Review of Systems  Gastrointestinal:  Positive for abdominal pain.    Physical Exam Updated Vital Signs BP 120/81 (BP Location: Right Arm)   Pulse 80   Temp 97.9 F (36.6 C) (Oral)   Resp 18   Ht 6\' 3"  (1.905 m)   Wt (!) 178.9 kg   SpO2 100%   BMI 49.30 kg/m  Physical Exam Vitals and nursing Chambers reviewed. Exam conducted with a chaperone present.  Constitutional:      Appearance: Normal  appearance.  HENT:     Head: Normocephalic and atraumatic.  Eyes:     General: No scleral icterus.       Right eye: No discharge.        Left eye: No discharge.     Extraocular Movements: Extraocular movements intact.     Pupils: Pupils are equal, round, and reactive to light.  Cardiovascular:     Rate and Rhythm: Normal rate and regular rhythm.     Pulses: Normal pulses.     Heart sounds: Normal heart sounds. No murmur heard.    No friction rub. No gallop.  Pulmonary:     Effort: Pulmonary effort is normal. No respiratory distress.     Breath sounds: Normal breath sounds.  Abdominal:     General: Abdomen is flat. Bowel sounds are normal. There is no distension.     Palpations: Abdomen is soft.     Tenderness: There is abdominal tenderness in the epigastric area.     Hernia: A hernia is present. Hernia is present in the ventral area.     Comments: Abdominal tenderness superior to the umbilical, ventral hernias palpable   Skin:    General: Skin is warm and dry.     Coloration: Skin is not jaundiced.  Neurological:     Mental Status: He is alert. Mental status is  at baseline.     Coordination: Coordination normal.     ED Results / Procedures / Treatments   Labs (all labs ordered are listed, but only abnormal results are displayed) Labs Reviewed  COMPREHENSIVE METABOLIC PANEL - Abnormal; Notable for the following components:      Result Value   Glucose, Bld 103 (*)    All other components within normal limits  URINALYSIS, ROUTINE W REFLEX MICROSCOPIC - Abnormal; Notable for the following components:   Color, Urine COLORLESS (*)    Specific Gravity, Urine >1.046 (*)    Protein, ur TRACE (*)    All other components within normal limits  CBC WITH DIFFERENTIAL/PLATELET  LIPASE, BLOOD    EKG None  Radiology CT Abdomen Pelvis W Contrast  Result Date: 07/03/2022 CLINICAL DATA:  Mid abdominal pain with history of hernia. Bowel obstruction suspected. EXAM: CT ABDOMEN AND  PELVIS WITH CONTRAST TECHNIQUE: Multidetector CT imaging of the abdomen and pelvis was performed using the standard protocol following bolus administration of intravenous contrast. RADIATION DOSE REDUCTION: This exam was performed according to the departmental dose-optimization program which includes automated exposure control, adjustment of the mA and/or kV according to patient size and/or use of iterative reconstruction technique. CONTRAST:  147mL OMNIPAQUE IOHEXOL 300 MG/ML  SOLN COMPARISON:  None Available. FINDINGS: Lower chest: No acute abnormality. Hepatobiliary: No focal liver abnormality is seen. No gallstones, gallbladder wall thickening, or biliary dilatation. Pancreas: Unremarkable. No pancreatic ductal dilatation or surrounding inflammatory changes. Spleen: Normal in size without focal abnormality. Adrenals/Urinary Tract: The adrenal glands are within normal limits. The kidneys enhance symmetrically. No renal calculus or hydronephrosis. The bladder is unremarkable. Stomach/Bowel: Stomach is within normal limits. Appendix appears normal. No evidence of bowel wall thickening, distention, or inflammatory changes. No free air or pneumatosis. Vascular/Lymphatic: No significant vascular findings are present. No enlarged abdominal or pelvic lymph nodes. Reproductive: Prostate is unremarkable. Other: No abdominopelvic ascites. Mild fat containing inguinal hernias are present bilaterally. There is a fat containing umbilical hernia. A hernia is noted in the upper abdomen in the midline containing a portion of the omentum. Mild fat stranding is noted in the hernia fat. Musculoskeletal: No acute osseous abnormality. IMPRESSION: 1. No evidence of bowel obstruction. 2. Fat containing inguinal hernia in the upper abdomen in the midline superior to the umbilicus containing a portion of omentum. Mild fat stranding is noted in the hernia sac, possible small omental infarct versus other inflammatory process.  Electronically Signed   By: Brett Fairy M.D.   On: 07/03/2022 20:32    Procedures Procedures    Medications Ordered in ED Medications  fentaNYL (SUBLIMAZE) injection 50 mcg (50 mcg Intravenous Not Given 07/03/22 1925)  iohexol (OMNIPAQUE) 300 MG/ML solution 100 mL (100 mLs Intravenous Contrast Given 07/03/22 2003)    ED Course/ Medical Decision Making/ A&P                             Medical Decision Making Amount and/or Complexity of Data Reviewed Labs: ordered. Radiology: ordered.  Risk Prescription drug management.   Patient presents to the emergency department due to abdominal pain.  Differential includes incarcerated hernia, early appendicitis, symptomatic ventral hernia, gastritis, cholecystitis, pancreatitis.  Will check labs, proceed with CT abdomen.  The hernia is reducible so lower suspicion for incarceration or strangulation.  Also abdomen is nonperitoneal, patient meets no SIRS criteria and clearly not septic.  Laboratory workup unremarkable, no leukocytosis no anemia, lipase within  normal is, CMP without gross electro derangement or AKI or transaminitis, UA is negative for UTI  CT abdomen notable for omental infarct, ventral hernia with fat but no obstruction.  Discussed results with patient, encouraged him to continue following up with general surgery for further recommendations and possible repair for symptomatic hernia.  Stable for discharge at this time.        Final Clinical Impression(s) / ED Diagnoses Final diagnoses:  Ventral hernia without obstruction or gangrene    Rx / DC Orders ED Discharge Orders     None         Sherrill Raring, PA-C 07/03/22 2258    Lennice Sites, DO 07/03/22 2315

## 2022-07-03 NOTE — Discharge Instructions (Signed)
You are seen today in the emergency department due to abdominal pain.  Your today was reassuring, follow-up with general surgery as planned to discuss hernia repair.  Turn to the ED if you have new or concerning symptoms such as the hernia becoming hard, nausea and vomiting, severe pain that does not relent, etc

## 2022-07-03 NOTE — ED Triage Notes (Signed)
Patient here POV from Home.  Endorses Mid ABD Pain that began Thursday. History of Hernia. Scheduled to see Surgery in 10 Days.   No Fever. No N/V/D.   NAD Noted during Triage. A&Ox4. GCS 15. Ambulatory.

## 2022-07-25 NOTE — Progress Notes (Signed)
Sent message, via epic in basket, requesting orders in epic from surgeon.  

## 2022-08-01 ENCOUNTER — Encounter (HOSPITAL_COMMUNITY)
Admission: RE | Admit: 2022-08-01 | Discharge: 2022-08-01 | Disposition: A | Discharge status: Home / Self Care | Payer: 59 | Source: Ambulatory Visit | Attending: Internal Medicine | Admitting: Internal Medicine

## 2022-08-01 NOTE — Progress Notes (Signed)
Second request for pre op orders in [facility]: Spoke with Joni Reining at [facility].

## 2022-08-01 NOTE — Patient Instructions (Signed)
SURGICAL WAITING ROOM VISITATION  Patients having surgery or a procedure may have no more than 2 support people in the waiting area - these visitors may rotate.    Children under the age of 61 must have an adult with them who is not the patient.  Due to an increase in RSV and influenza rates and associated hospitalizations, children ages 106 and under may not visit patients in [facility] hospitals.  If the patient needs to stay at the hospital during part of their recovery, the visitor guidelines for inpatient rooms apply. Pre-op nurse will coordinate an appropriate time for 1 support person to accompany patient in pre-op.  This support person may not rotate.    Please refer to the [facility] website for the visitor guidelines for Inpatients (after your surgery is over and you are in a regular room).    Your procedure is scheduled on: 08/19/22   Report to [facility] Main Entrance    Report to admitting at 5:50 AM   Call this number if you have problems the morning of surgery [phone]   Do not eat food :After Midnight.   After Midnight you may have the following liquids until 5:05 AM DAY OF SURGERY  Water Non-Citrus Juices (without pulp, NO RED-Apple, White grape, White cranberry) Black Coffee (NO MILK/CREAM OR CREAMERS, sugar ok)  Clear Tea (NO MILK/CREAM OR CREAMERS, sugar ok) regular and decaf                             Plain Jell-O (NO RED)                                           Fruit ices (not with fruit pulp, NO RED)                                     Popsicles (NO RED)                                                               Sports drinks like Gatorade (NO RED)              Drink 2 Ensure/G2 drinks AT 10:00 PM the night before surgery.        The day of surgery:  Drink ONE (1) Pre-Surgery Clear Ensure or G2 at AM the morning of surgery. Drink in one sitting. Do not sip.  This drink was given to you during your hospital  pre-op appointment  visit. Nothing else to drink after completing the  Pre-Surgery Clear Ensure or G2.          If you have questions, please contact your surgeon's office.   FOLLOW BOWEL PREP AND ANY ADDITIONAL PRE OP INSTRUCTIONS YOU RECEIVED FROM YOUR SURGEON'S OFFICE!!!     Oral Hygiene is also important to reduce your risk of infection.  Remember - BRUSH YOUR TEETH THE MORNING OF SURGERY WITH YOUR REGULAR TOOTHPASTE  DENTURES WILL BE REMOVED PRIOR TO SURGERY PLEASE DO NOT APPLY "Poly grip" OR ADHESIVES!!!   Do NOT smoke after Midnight   Take these medicines the morning of surgery with A SIP OF WATER: Albuterol, Claritin, Symbicort   DO NOT TAKE ANY ORAL DIABETIC MEDICATIONS DAY OF YOUR SURGERY  Bring CPAP mask and tubing day of surgery.                              You may not have any metal on your body including jewelry, and body piercing             Do not wear lotions, powders, cologne, or deodorant  Do not shave  48 hours prior to surgery.               Men may shave face and neck.   Do not bring valuables to the hospital. [facility] IS NOT             RESPONSIBLE   FOR VALUABLES.   Contacts, glasses, dentures or bridgework may not be worn into surgery.  DO NOT BRING YOUR HOME MEDICATIONS TO THE HOSPITAL. PHARMACY WILL DISPENSE MEDICATIONS LISTED ON YOUR MEDICATION LIST TO YOU DURING YOUR ADMISSION IN THE HOSPITAL!    Patients discharged on the day of surgery will not be allowed to drive home.  Someone NEEDS to stay with you for the first 24 hours after anesthesia.   Special Instructions: Bring a copy of your healthcare power of attorney and living will documents the day of surgery if you haven't scanned them before.              Please read over the following fact sheets you were given: IF YOU HAVE QUESTIONS ABOUT YOUR PRE-OP INSTRUCTIONS PLEASE CALL [phone] Contras    If you received a COVID test during your pre-op visit  it is requested  that you wear a mask when out in public, stay away from anyone that may not be feeling well and notify your surgeon if you develop symptoms. If you test positive for Covid or have been in contact with anyone that has tested positive in the last 10 days please notify you surgeon.    [place] - Preparing for Surgery Before surgery, you can play an important role.  Because skin is not sterile, your skin needs to be as free of germs as possible.  You can reduce the number of germs on your skin by washing with CHG (chlorahexidine gluconate) soap before surgery.  CHG is an antiseptic cleaner which kills germs and bonds with the skin to continue killing germs even after washing. Please DO NOT use if you have an allergy to CHG or antibacterial soaps.  If your skin becomes reddened/irritated stop using the CHG and inform your nurse when you arrive at Short Stay. Do not shave (including legs and underarms) for at least 48 hours prior to the first CHG shower.  You may shave your face/neck.  Please follow these instructions carefully:  1.  Shower with CHG Soap the night before surgery and the  morning of surgery.  2.  If you choose to wash your hair, wash your hair first as usual with your normal  shampoo.  3.  After you shampoo, rinse your hair and body thoroughly to remove the shampoo.  4.  Use CHG as you would any other liquid soap.  You can apply chg directly to the skin and wash.  Gently with a scrungie or clean washcloth.  5.  Apply the CHG Soap to your body ONLY FROM THE NECK DOWN.   Do   not use on face/ open                           Wound or open sores. Avoid contact with eyes, ears mouth and   genitals (private parts).                       Wash face,  Genitals (private parts) with your normal soap.             6.  Wash thoroughly, paying special attention to the area where your    surgery  will be performed.  7.  Thoroughly rinse your body with warm water from the neck  down.  8.  DO NOT shower/wash with your normal soap after using and rinsing off the CHG Soap.                9.  Pat yourself dry with a clean towel.            10.  Wear clean pajamas.            11.  Place clean sheets on your bed the night of your first shower and do not  sleep with pets. Day of Surgery : Do not apply any lotions/deodorants the morning of surgery.  Please wear clean clothes to the hospital/surgery center.  FAILURE TO FOLLOW THESE INSTRUCTIONS MAY RESULT IN THE CANCELLATION OF YOUR SURGERY  PATIENT SIGNATURE_________________________________  NURSE SIGNATURE__________________________________  ________________________________________________________________________

## 2022-08-01 NOTE — Progress Notes (Signed)
Sent message, via epic in basket, requesting orders in epic from surgeon.  

## 2022-08-01 NOTE — Progress Notes (Signed)
Please place orders for PAT appointment scheduled 08/03/22.

## 2022-08-01 NOTE — Progress Notes (Signed)
COVID Vaccine Completed: yes  Date of COVID positive in last 90 days:  PCP - Charlane Ferretti, DO Cardiologist -   Chest x-ray -  EKG -  Stress Test -  ECHO -  Cardiac Cath -  Pacemaker/ICD device last checked: Spinal Cord Stimulator:  Bowel Prep -   Sleep Study -  CPAP -   Fasting Blood Sugar -  Checks Blood Sugar _____ times a day  Last dose of GLP1 agonist-  N/A GLP1 instructions:  N/A   Last dose of SGLT-2 inhibitors-  N/A SGLT-2 instructions: N/A   Blood Thinner Instructions: Aspirin Instructions: Last Dose:  Activity level:  Can go up a flight of stairs and perform activities of daily living without stopping and without symptoms of chest pain or shortness of breath.  Able to exercise without symptoms  Unable to go up a flight of stairs without symptoms of     Anesthesia review:   Patient denies shortness of breath, fever, cough and chest pain at PAT appointment  Patient verbalized understanding of instructions that were given to them at the PAT appointment. Patient was also instructed that they will need to review over the PAT instructions again at home before surgery.

## 2022-08-02 ENCOUNTER — Ambulatory Visit: Payer: Self-pay | Admitting: General Surgery

## 2022-08-03 ENCOUNTER — Encounter (HOSPITAL_COMMUNITY): Payer: Self-pay

## 2022-08-03 ENCOUNTER — Other Ambulatory Visit: Payer: Self-pay

## 2022-08-03 ENCOUNTER — Encounter (HOSPITAL_COMMUNITY)
Admission: RE | Admit: 2022-08-03 | Discharge: 2022-08-03 | Disposition: A | Discharge status: Home / Self Care | Payer: 59 | Source: Ambulatory Visit | Attending: General Surgery | Admitting: General Surgery

## 2022-08-03 VITALS — BP 148/98 | HR 76 | Temp 98.5°F | Resp 14 | Ht 75.0 in | Wt 392.0 lb

## 2022-08-03 DIAGNOSIS — Z01818 Encounter for other preprocedural examination: Secondary | ICD-10-CM | POA: Diagnosis present

## 2022-08-03 DIAGNOSIS — I10 Essential (primary) hypertension: Secondary | ICD-10-CM | POA: Insufficient documentation

## 2022-08-03 HISTORY — DX: Unspecified osteoarthritis, unspecified site: M19.90

## 2022-08-03 LAB — BASIC METABOLIC PANEL
Anion gap: 9 (ref 5–15)
BUN: 16 mg/dL (ref 6–20)
CO2: 25 mmol/L (ref 22–32)
Calcium: 8.7 mg/dL — ABNORMAL LOW (ref 8.9–10.3)
Chloride: 104 mmol/L (ref 98–111)
Creatinine, Ser: 0.99 mg/dL (ref 0.61–1.24)
GFR, Estimated: >60 mL/min (ref >60)
Glucose, Bld: 101 mg/dL — ABNORMAL HIGH (ref 70–99)
Potassium: 4.3 mmol/L (ref 3.5–5.1)
Sodium: 138 mmol/L (ref 135–145)

## 2022-08-03 LAB — CBC
HCT: 47.5 % (ref 39.0–52.0)
Hemoglobin: 15.3 g/dL (ref 13.0–17.0)
MCH: 28.1 pg (ref 26.0–34.0)
MCHC: 32.2 g/dL (ref 30.0–36.0)
MCV: 87.3 fL (ref 80.0–100.0)
Platelets: 257 10*3/uL (ref 150–400)
RBC: 5.44 MIL/uL (ref 4.22–5.81)
RDW: 13.8 % (ref 11.5–15.5)
WBC: 5.3 10*3/uL (ref 4.0–10.5)
nRBC: 0 % (ref 0.0–0.2)

## 2022-08-18 NOTE — Anesthesia Preprocedure Evaluation (Addendum)
Anesthesia Evaluation  Patient identified by MRN, date of birth, ID band Patient awake    Reviewed: Allergy & Precautions, H&P , NPO status , Patient's Chart, lab work & pertinent test results  History of Anesthesia Complications Negative for: history of anesthetic complications  Airway Mallampati: I  TM Distance: >3 FB Neck ROM: Full    Dental  (+) Teeth Intact, Dental Advisory Given   Pulmonary neg pulmonary ROS, asthma    Pulmonary exam normal breath sounds clear to auscultation       Cardiovascular Exercise Tolerance: Good negative cardio ROS Normal cardiovascular exam+ Valvular Problems/Murmurs  Rhythm:Regular Rate:Normal     Neuro/Psych negative neurological ROS  negative psych ROS   GI/Hepatic negative GI ROS, Neg liver ROS,,,  Endo/Other  negative endocrine ROS  Morbid obesity  Renal/GU negative Renal ROS  negative genitourinary   Musculoskeletal negative musculoskeletal ROS (+) Arthritis ,    Abdominal  (+) + obese  Peds negative pediatric ROS (+)  Hematology negative hematology ROS (+)   Anesthesia Other Findings   Reproductive/Obstetrics negative OB ROS                             Anesthesia Physical Anesthesia Plan  ASA: 2  Anesthesia Plan: General   Post-op Pain Management: Tylenol PO (pre-op)* and Celebrex PO (pre-op)*   Induction: Intravenous  PONV Risk Score and Plan: 2 and Ondansetron, Dexamethasone and Treatment may vary due to age or medical condition  Airway Management Planned: Oral ETT  Additional Equipment: None  Intra-op Plan:   Post-operative Plan: Extubation in OR  Informed Consent: I have reviewed the patients History and Physical, chart, labs and discussed the procedure including the risks, benefits and alternatives for the proposed anesthesia with the patient or authorized representative who has indicated his/her understanding and acceptance.      Dental advisory given  Plan Discussed with: CRNA, Surgeon and Anesthesiologist  Anesthesia Plan Comments:         Anesthesia Quick Evaluation

## 2022-08-19 ENCOUNTER — Encounter (HOSPITAL_COMMUNITY)
Admission: RE | Disposition: A | Discharge status: Home / Self Care | Payer: Self-pay | Source: Ambulatory Visit | Attending: General Surgery

## 2022-08-19 ENCOUNTER — Other Ambulatory Visit: Payer: Self-pay

## 2022-08-19 ENCOUNTER — Ambulatory Visit (HOSPITAL_COMMUNITY)
Admission: RE | Admit: 2022-08-19 | Discharge: 2022-08-19 | Disposition: A | Discharge status: Home / Self Care | Payer: 59 | Source: Ambulatory Visit | Attending: General Surgery | Admitting: General Surgery

## 2022-08-19 ENCOUNTER — Ambulatory Visit (HOSPITAL_COMMUNITY): Payer: 59 | Admitting: Certified Registered Nurse Anesthetist

## 2022-08-19 ENCOUNTER — Encounter (HOSPITAL_COMMUNITY): Payer: Self-pay | Admitting: General Surgery

## 2022-08-19 ENCOUNTER — Ambulatory Visit (HOSPITAL_BASED_OUTPATIENT_CLINIC_OR_DEPARTMENT_OTHER): Payer: 59 | Admitting: Certified Registered Nurse Anesthetist

## 2022-08-19 DIAGNOSIS — Z6841 Body Mass Index (BMI) 40.0 and over, adult: Secondary | ICD-10-CM

## 2022-08-19 DIAGNOSIS — M1990 Unspecified osteoarthritis, unspecified site: Secondary | ICD-10-CM | POA: Diagnosis not present

## 2022-08-19 DIAGNOSIS — J45909 Unspecified asthma, uncomplicated: Secondary | ICD-10-CM | POA: Diagnosis not present

## 2022-08-19 DIAGNOSIS — K439 Ventral hernia without obstruction or gangrene: Secondary | ICD-10-CM

## 2022-08-19 SURGERY — REPAIR, HERNIA, UMBILICAL, ROBOT-ASSISTED
Anesthesia: General

## 2022-08-19 MED ORDER — PROPOFOL 500 MG/50ML IV EMUL
INTRAVENOUS | Status: AC
  Filled 2022-08-19: qty 50

## 2022-08-19 MED ORDER — KETAMINE HCL 10 MG/ML IJ SOLN
INTRAMUSCULAR | Status: DC | PRN
  Administered 2022-08-19: 30 mg via INTRAVENOUS
  Administered 2022-08-19: 20 mg via INTRAVENOUS

## 2022-08-19 MED ORDER — CHLORHEXIDINE GLUCONATE CLOTH 2 % EX PADS: 6.0000 | MEDICATED_PAD | Freq: Once | CUTANEOUS | Status: DC

## 2022-08-19 MED ORDER — MEPERIDINE HCL 50 MG/ML IJ SOLN: 6.2500 mg | INTRAMUSCULAR | Status: DC | PRN

## 2022-08-19 MED ORDER — ACETAMINOPHEN 500 MG PO TABS: 1000.0000 mg | ORAL_TABLET | ORAL | Status: DC

## 2022-08-19 MED ORDER — CHLORHEXIDINE GLUCONATE 0.12 % MT SOLN
15.0000 mL | Freq: Once | OROMUCOSAL | Status: AC
  Administered 2022-08-19: 15 mL via OROMUCOSAL

## 2022-08-19 MED ORDER — ROCURONIUM BROMIDE 10 MG/ML (PF) SYRINGE
PREFILLED_SYRINGE | INTRAVENOUS | Status: AC
  Filled 2022-08-19: qty 10

## 2022-08-19 MED ORDER — KETAMINE HCL 50 MG/5ML IJ SOSY
PREFILLED_SYRINGE | INTRAMUSCULAR | Status: AC
  Filled 2022-08-19: qty 5

## 2022-08-19 MED ORDER — ACETAMINOPHEN 325 MG PO TABS: 325.0000 mg | ORAL_TABLET | ORAL | Status: DC | PRN

## 2022-08-19 MED ORDER — ONDANSETRON HCL 4 MG/2ML IJ SOLN: 4.0000 mg | Freq: Once | INTRAMUSCULAR | Status: DC | PRN

## 2022-08-19 MED ORDER — DEXAMETHASONE SODIUM PHOSPHATE 10 MG/ML IJ SOLN
INTRAMUSCULAR | Status: AC
  Filled 2022-08-19: qty 1

## 2022-08-19 MED ORDER — MIDAZOLAM HCL 5 MG/5ML IJ SOLN
INTRAMUSCULAR | Status: DC | PRN
  Administered 2022-08-19: 2 mg via INTRAVENOUS

## 2022-08-19 MED ORDER — CELECOXIB 200 MG PO CAPS
400.0000 mg | ORAL_CAPSULE | ORAL | Status: AC
  Administered 2022-08-19: 400 mg via ORAL
  Filled 2022-08-19: qty 2

## 2022-08-19 MED ORDER — FENTANYL CITRATE PF 50 MCG/ML IJ SOSY
25.0000 ug | PREFILLED_SYRINGE | INTRAMUSCULAR | Status: DC | PRN
  Administered 2022-08-19: 50 ug via INTRAVENOUS

## 2022-08-19 MED ORDER — BUPIVACAINE HCL (PF) 0.25 % IJ SOLN
INTRAMUSCULAR | Status: AC
  Filled 2022-08-19: qty 30

## 2022-08-19 MED ORDER — FENTANYL CITRATE (PF) 100 MCG/2ML IJ SOLN
INTRAMUSCULAR | Status: AC
  Filled 2022-08-19: qty 2

## 2022-08-19 MED ORDER — DEXAMETHASONE SODIUM PHOSPHATE 4 MG/ML IJ SOLN
INTRAMUSCULAR | Status: DC | PRN
  Administered 2022-08-19: 10 mg via INTRAVENOUS

## 2022-08-19 MED ORDER — 0.9 % SODIUM CHLORIDE (POUR BTL) OPTIME
TOPICAL | Status: DC | PRN
  Administered 2022-08-19: 1000 mL

## 2022-08-19 MED ORDER — OXYCODONE HCL 5 MG PO TABS
ORAL_TABLET | ORAL | Status: AC
  Filled 2022-08-19: qty 1

## 2022-08-19 MED ORDER — ACETAMINOPHEN 160 MG/5ML PO SOLN: 325.0000 mg | ORAL | Status: DC | PRN

## 2022-08-19 MED ORDER — CEFAZOLIN IN SODIUM CHLORIDE 3-0.9 GM/100ML-% IV SOLN
3.0000 g | INTRAVENOUS | Status: AC
  Administered 2022-08-19: 3 g via INTRAVENOUS
  Filled 2022-08-19: qty 100

## 2022-08-19 MED ORDER — FENTANYL CITRATE (PF) 250 MCG/5ML IJ SOLN
INTRAMUSCULAR | Status: AC
  Filled 2022-08-19: qty 5

## 2022-08-19 MED ORDER — FENTANYL CITRATE (PF) 100 MCG/2ML IJ SOLN
INTRAMUSCULAR | Status: DC | PRN
  Administered 2022-08-19 (×3): 50 ug via INTRAVENOUS
  Administered 2022-08-19 (×2): 100 ug via INTRAVENOUS
  Administered 2022-08-19: 50 ug via INTRAVENOUS

## 2022-08-19 MED ORDER — ROCURONIUM BROMIDE 10 MG/ML (PF) SYRINGE
PREFILLED_SYRINGE | INTRAVENOUS | Status: DC | PRN
  Administered 2022-08-19: 30 mg via INTRAVENOUS
  Administered 2022-08-19: 10 mg via INTRAVENOUS
  Administered 2022-08-19: 100 mg via INTRAVENOUS
  Administered 2022-08-19: 20 mg via INTRAVENOUS

## 2022-08-19 MED ORDER — ORAL CARE MOUTH RINSE: 15.0000 mL | Freq: Once | OROMUCOSAL | Status: AC

## 2022-08-19 MED ORDER — ACETAMINOPHEN 500 MG PO TABS
1000.0000 mg | ORAL_TABLET | Freq: Once | ORAL | Status: AC
  Administered 2022-08-19: 1000 mg via ORAL
  Filled 2022-08-19: qty 2

## 2022-08-19 MED ORDER — PROPOFOL 10 MG/ML IV BOLUS
INTRAVENOUS | Status: DC | PRN
  Administered 2022-08-19: 200 mg via INTRAVENOUS

## 2022-08-19 MED ORDER — OXYCODONE HCL 5 MG PO TABS
5.0000 mg | ORAL_TABLET | Freq: Once | ORAL | Status: AC | PRN
  Administered 2022-08-19: 5 mg via ORAL

## 2022-08-19 MED ORDER — CELECOXIB 200 MG PO CAPS: 200.0000 mg | ORAL_CAPSULE | Freq: Once | ORAL | Status: DC

## 2022-08-19 MED ORDER — LACTATED RINGERS IR SOLN
Status: DC | PRN
  Administered 2022-08-19: 1000 mL

## 2022-08-19 MED ORDER — BUPIVACAINE LIPOSOME 1.3 % IJ SUSP
INTRAMUSCULAR | Status: AC
  Filled 2022-08-19: qty 20

## 2022-08-19 MED ORDER — BUPIVACAINE HCL (PF) 0.25 % IJ SOLN
INTRAMUSCULAR | Status: DC | PRN
  Administered 2022-08-19: 30 mL

## 2022-08-19 MED ORDER — MIDAZOLAM HCL 2 MG/2ML IJ SOLN
INTRAMUSCULAR | Status: AC
  Filled 2022-08-19: qty 2

## 2022-08-19 MED ORDER — BUPIVACAINE LIPOSOME 1.3 % IJ SUSP: 20.0000 mL | Freq: Once | INTRAMUSCULAR | Status: DC

## 2022-08-19 MED ORDER — IBUPROFEN 800 MG PO TABS
800.0000 mg | ORAL_TABLET | Freq: Three times a day (TID) | ORAL | 0 refills | Status: AC | PRN
Start: 2022-08-19 — End: ?

## 2022-08-19 MED ORDER — SUGAMMADEX SODIUM 200 MG/2ML IV SOLN
INTRAVENOUS | Status: DC | PRN
  Administered 2022-08-19: 400 mg via INTRAVENOUS

## 2022-08-19 MED ORDER — LACTATED RINGERS IV SOLN: INTRAVENOUS | Status: DC

## 2022-08-19 MED ORDER — LIDOCAINE HCL (PF) 2 % IJ SOLN
INTRAMUSCULAR | Status: AC
  Filled 2022-08-19: qty 5

## 2022-08-19 MED ORDER — LIDOCAINE 2% (20 MG/ML) 5 ML SYRINGE
INTRAMUSCULAR | Status: DC | PRN
  Administered 2022-08-19: 60 mg via INTRAVENOUS

## 2022-08-19 MED ORDER — OXYCODONE HCL 5 MG PO TABS: 5.0000 mg | ORAL_TABLET | Freq: Four times a day (QID) | ORAL | 0 refills | Status: DC | PRN

## 2022-08-19 MED ORDER — GLYCOPYRROLATE 0.2 MG/ML IJ SOLN
INTRAMUSCULAR | Status: AC
  Filled 2022-08-19: qty 1

## 2022-08-19 MED ORDER — ENSURE PRE-SURGERY PO LIQD
296.0000 mL | Freq: Once | ORAL | Status: DC
  Filled 2022-08-19: qty 296

## 2022-08-19 MED ORDER — FENTANYL CITRATE PF 50 MCG/ML IJ SOSY
PREFILLED_SYRINGE | INTRAMUSCULAR | Status: AC
  Filled 2022-08-19: qty 2

## 2022-08-19 MED ORDER — OXYCODONE HCL 5 MG/5ML PO SOLN: 5.0000 mg | Freq: Once | ORAL | Status: AC | PRN

## 2022-08-19 MED ORDER — ONDANSETRON HCL 4 MG/2ML IJ SOLN
INTRAMUSCULAR | Status: AC
  Filled 2022-08-19: qty 2

## 2022-08-19 MED ORDER — BUPIVACAINE LIPOSOME 1.3 % IJ SUSP
INTRAMUSCULAR | Status: DC | PRN
  Administered 2022-08-19: 20 mL

## 2022-08-19 MED ORDER — ONDANSETRON HCL 4 MG/2ML IJ SOLN
INTRAMUSCULAR | Status: DC | PRN
  Administered 2022-08-19: 4 mg via INTRAVENOUS

## 2022-08-19 SURGICAL SUPPLY — 55 items
ADH SKN CLS APL DERMABOND .7 (GAUZE/BANDAGES/DRESSINGS) ×1
ANTIFOG SOL W/FOAM PAD STRL (MISCELLANEOUS) ×1
APL PRP STRL LF DISP 70% ISPRP (MISCELLANEOUS) ×1
BAG COUNTER SPONGE SURGICOUNT (BAG) ×1 IMPLANT
BAG SPNG CNTER NS LX DISP (BAG) ×1
BLADE SURG SZ11 CARB STEEL (BLADE) ×1 IMPLANT
CHLORAPREP W/TINT 26 (MISCELLANEOUS) ×1 IMPLANT
COVER TIP SHEARS 8 DVNC (MISCELLANEOUS) ×1 IMPLANT
DERMABOND ADVANCED .7 DNX12 (GAUZE/BANDAGES/DRESSINGS) IMPLANT
DEVICE TROCAR PUNCTURE CLOSURE (ENDOMECHANICALS) IMPLANT
DRAPE ARM DVNC X/XI (DISPOSABLE) ×4 IMPLANT
DRAPE COLUMN DVNC XI (DISPOSABLE) ×1 IMPLANT
DRIVER NDL LRG 8 DVNC XI (INSTRUMENTS) ×1 IMPLANT
DRIVER NDL MEGA 8 DVNC XI (INSTRUMENTS) ×2 IMPLANT
DRIVER NDLE LRG 8 DVNC XI (INSTRUMENTS) IMPLANT
DRIVER NDLE MEGA DVNC XI (INSTRUMENTS) ×1 IMPLANT
ELECT REM PT RETURN 15FT ADLT (MISCELLANEOUS) ×1 IMPLANT
FORCEPS CADIERE DVNC XI (FORCEP) ×1 IMPLANT
GAUZE 4X4 16PLY ~~LOC~~+RFID DBL (SPONGE) ×1 IMPLANT
GAUZE SPONGE 2X2 8PLY STRL LF (GAUZE/BANDAGES/DRESSINGS) IMPLANT
GLOVE BIOGEL PI IND STRL 7.0 (GLOVE) ×2 IMPLANT
GLOVE SURG SS PI 7.0 STRL IVOR (GLOVE) ×2 IMPLANT
GOWN STRL REUS W/ TWL LRG LVL3 (GOWN DISPOSABLE) ×2 IMPLANT
GOWN STRL REUS W/ TWL XL LVL3 (GOWN DISPOSABLE) IMPLANT
GOWN STRL REUS W/TWL LRG LVL3 (GOWN DISPOSABLE) ×2
GOWN STRL REUS W/TWL XL LVL3 (GOWN DISPOSABLE) ×1
GRASPER SUT TROCAR 14GX15 (MISCELLANEOUS) IMPLANT
GRASPER TIP-UP FEN DVNC XI (INSTRUMENTS) ×1 IMPLANT
KIT BASIN OR (CUSTOM PROCEDURE TRAY) ×1 IMPLANT
KIT TURNOVER KIT A (KITS) IMPLANT
MANIFOLD NEPTUNE II (INSTRUMENTS) ×1 IMPLANT
MARKER SKIN DUAL TIP RULER LAB (MISCELLANEOUS) ×1 IMPLANT
MESH SOFT 12X12IN BARD (Mesh General) IMPLANT
NDL HYPO 22X1.5 SAFETY MO (MISCELLANEOUS) ×1 IMPLANT
NEEDLE HYPO 22X1.5 SAFETY MO (MISCELLANEOUS) ×1 IMPLANT
SCISSORS LAP 5X35 DISP (ENDOMECHANICALS) IMPLANT
SCISSORS MNPLR CVD DVNC XI (INSTRUMENTS) ×1 IMPLANT
SEAL UNIV 5-12 XI (MISCELLANEOUS) ×3 IMPLANT
SEALER VESSEL EXT DVNC XI (MISCELLANEOUS) IMPLANT
SET TUBE SMOKE EVAC HIGH FLOW (TUBING) ×1 IMPLANT
SOLUTION ANTFG W/FOAM PAD STRL (MISCELLANEOUS) ×1 IMPLANT
SPIKE FLUID TRANSFER (MISCELLANEOUS) ×1 IMPLANT
SUT MNCRL AB 4-0 PS2 18 (SUTURE) ×1 IMPLANT
SUT STRATAFIX 0 PDS+ CT-2 23 (SUTURE) ×2
SUT STRATAFIX SPIRAL PDS3-0 (SUTURE) ×1 IMPLANT
SUT VIC AB 3-0 SH 27 (SUTURE)
SUT VIC AB 3-0 SH 27XBRD (SUTURE) IMPLANT
SUTURE STRATFX 0 PDS+ CT-2 23 (SUTURE) ×1 IMPLANT
SYR 20ML LL LF (SYRINGE) ×2 IMPLANT
SYR CONTROL 10ML LL (SYRINGE) ×1 IMPLANT
TOWEL OR 17X26 10 PK STRL BLUE (TOWEL DISPOSABLE) ×1 IMPLANT
TOWEL OR NON WOVEN STRL DISP B (DISPOSABLE) ×1 IMPLANT
TRAY FOLEY MTR SLVR 16FR STAT (SET/KITS/TRAYS/PACK) IMPLANT
TRAY LAPAROSCOPIC (CUSTOM PROCEDURE TRAY) ×1 IMPLANT
TROCAR Z-THREAD OPTICAL 5X100M (TROCAR) ×1 IMPLANT

## 2022-08-19 NOTE — Op Note (Signed)
Preoperative diagnosis: 8 cm ventral hernia  Postoperative diagnosis: same   Procedure: robotic 8 cm ventral repair with mesh  Surgeon: Feliciana Rossetti, M.D.  Asst: none  Anesthesia: general  Indications for procedure: Jose Chambers is a 32 y.o. year old male with symptoms of abdominal pain. he was found to have a ventral hernia. After discussed options, decision was made to proceed with robotic minimally invasive repair with mesh.  Description of procedure: The patient was brought into the operative suite. Anesthesia was administered with General endotracheal anesthesia. WHO checklist was applied. The patient was then placed in supine position with tucked arms. The area was prepped and draped in the usual sterile fashion.  Next, a left subcostal incision was made. A 5mm trocar was used to gain access to the peritoneal cavity by optical entry technique. Pneumoperitoneum was applied with a high flow and low pressure. The laparoscope was reinserted to confirm position. Bilateral TAP blocks were placed with Marcaine/Exparel mix. 3 additional 8 mm trocars were placed in the left mid and left lower abdomen. The subcostal incision was up-sized to an 8 mm trocar.  The peritoneum was incised and a plane created separating the peritoneum away from the posterior rectus sheath. This was done towards and across the midline to allow the hernia to be isolated in greater than 180 degrees. The hernia sac was dissected away from the fascia and subcutaneous tissues. There were 2 hernias, one umbilical and one epigastric both were 2 cm in diameter with a space of 5 cm between them for a total length of 8 cm. Care was taken to avoid skin injury. The fascial defects were closed with 2 running 0 symmetric Strattafix suture. A 20 x 25 cm piece of Bard Soft mesh was placed into the preperitoneal space. A 3-0 strattafix was used to repair multiple peritoneal tears from dissection. The peritoneal flap was re-approximated with  3-0 Strattafix.  Pneumoperitoneum was evacuated. Trocars were removed. All incisions were closed with 4-0 monocryl subcuticular sutures. Dermabond was put in place for dressing.  Findings: 2 cm umbilical hernia, 2 cm epigastric hernia with 5 cm of separation for total length of 8 cm.  Specimen: none  Implant: Bard Soft mesh   Blood loss: 100 ml  Local anesthesia: 50 ml of Marcaine/Exparel mix  Complications: none  Feliciana Rossetti, M.D. General, Bariatric, & Minimally Invasive Surgery [facility]

## 2022-08-19 NOTE — Anesthesia Procedure Notes (Signed)
Procedure Name: Intubation Date/Time: 08/19/2022 8:00 AM  Performed by: Vanessa Divernon, CRNAPre-anesthesia Checklist: Patient identified, Emergency Drugs available, Suction available and Patient being monitored Patient Re-evaluated:Patient Re-evaluated prior to induction Oxygen Delivery Method: Circle system utilized Preoxygenation: Pre-oxygenation with 100% oxygen Induction Type: IV induction Ventilation: Mask ventilation without difficulty Laryngoscope Size: 2 and Miller Grade View: Grade I Tube type: Oral Tube size: 8.0 mm Number of attempts: 1 Airway Equipment and Method: Stylet Placement Confirmation: ETT inserted through vocal cords under direct vision, positive ETCO2 and breath sounds checked- equal and bilateral Secured at: 21 cm Tube secured with: Tape Dental Injury: Teeth and Oropharynx as per pre-operative assessment

## 2022-08-19 NOTE — Transfer of Care (Signed)
Immediate Anesthesia Transfer of Care Note  Patient: Amar Astorino  Procedure(s) Performed: ROBOTIC UMBILICAL HERNIA REPAIR WITH MESH  Patient Location: PACU  Anesthesia Type:General  Level of Consciousness: drowsy and patient cooperative  Airway & Oxygen Therapy: Patient Spontanous Breathing and Patient connected to face mask  Post-op Assessment: Report given to RN and Post -op Vital signs reviewed and stable  Post vital signs: Reviewed and stable  Last Vitals:  Vitals Value Taken Time  BP 153/101 08/19/22 1140  Temp 36.8 C 08/19/22 1140  Pulse 94 08/19/22 1145  Resp 16 08/19/22 1145  SpO2 96 % 08/19/22 1145  Vitals shown include unvalidated device data.  Last Pain:  Vitals:   08/19/22 0625  TempSrc: Oral  PainSc:       Patients Stated Pain Goal: 4 (08/19/22 0617)  Complications: No notable events documented.

## 2022-08-19 NOTE — Anesthesia Postprocedure Evaluation (Signed)
Anesthesia Post Note  Patient: Jose Chambers  Procedure(s) Performed: ROBOTIC UMBILICAL HERNIA REPAIR WITH MESH     Patient location during evaluation: PACU Anesthesia Type: General Level of consciousness: awake and alert Pain management: pain level controlled Vital Signs Assessment: post-procedure vital signs reviewed and stable Respiratory status: spontaneous breathing, nonlabored ventilation, respiratory function stable and patient connected to nasal cannula oxygen Cardiovascular status: blood pressure returned to baseline and stable Postop Assessment: no apparent nausea or vomiting Anesthetic complications: no   No notable events documented.  Last Vitals:  Vitals:   08/19/22 1245 08/19/22 1318  BP: 137/81 (!) 140/90  Pulse: (!) 103   Resp: 13 15  Temp:  (!) 36.2 C  SpO2: 95% 96%    Last Pain:  Vitals:   08/19/22 1245  TempSrc:   PainSc: 4                  Talullah Abate

## 2022-08-19 NOTE — H&P (Signed)
Chief Complaint: Hernia (New problem)   History of Present Illness: Jose Chambers is a 32 y.o. male who is seen today for ventral hernia.  He had more symptoms and went into the ED last weekend and was diagnosed with omental infarct of a supraumbilical hernia   Review of Systems: A complete review of systems was obtained from the patient. I have reviewed this information and discussed as appropriate with the patient. See HPI as well for other ROS.  Review of Systems  Constitutional: Negative.  HENT: Negative.  Eyes: Negative.  Respiratory: Negative.  Cardiovascular: Negative.  Gastrointestinal: Negative.  Genitourinary: Negative.  Musculoskeletal: Negative.  Skin: Negative.  Neurological: Negative.  Endo/Heme/Allergies: Negative.  Psychiatric/Behavioral: Negative.    Medical History: Past Medical History:  Diagnosis Date  Asthma, unspecified asthma severity, unspecified whether complicated, unspecified whether persistent  Hypertension   There is no problem list on file for this patient.  Past Surgical History:  Procedure Laterality Date  boxers fracture N/A 08/2011  jones fracture 2016  SHOULDER ARTHROCENTESIS 08/2016  pinky N/A 07/2017    No Known Allergies  Current Outpatient Medications on File Prior to Visit  Medication Sig Dispense Refill  albuterol MDI, PROVENTIL, VENTOLIN, PROAIR, HFA 90 mcg/actuation inhaler 1-2 PUFF AS NEEDED INHALATION EVERY 4 HRS 90 DAYS  SYMBICORT 160-4.5 mcg/actuation inhaler TAKE TWO INHALATION TWICE DAILY INHALATION TWICE A DAY 90 DAYS   No current facility-administered medications on file prior to visit.   Family History  Problem Relation Age of Onset  High blood pressure (Hypertension) Mother  Sleep apnea Father  Diabetes Sister  Diabetes Paternal Grandmother    Social History   Tobacco Use  Smoking Status Former  Types: Cigarettes  Smokeless Tobacco Never    Social History   Socioeconomic History  Marital  status: Single  Tobacco Use  Smoking status: Former  Types: Cigarettes  Smokeless tobacco: Never  Substance and Sexual Activity  Alcohol use: Yes  Alcohol/week: 2.0 standard drinks of alcohol  Types: 2 Cans of beer per week  Drug use: Never   Objective:   Vitals:  07/13/22 1132 07/13/22 1133  BP: 110/72  Pulse: 85  Temp: 36.7 C (98 F)  SpO2: 98%  Weight: (!) 172.4 kg (380 lb)  Height: 190.5 cm (6\' 3" )  PainSc: 0-No pain   Body mass index is 47.5 kg/m.  Physical Exam Constitutional:  Appearance: Normal appearance.  HENT:  Head: Normocephalic and atraumatic.  Pulmonary:  Effort: Pulmonary effort is normal.  Abdominal:  Comments: Supraumbilical hernia, nontender  Musculoskeletal:  General: Normal range of motion.  Cervical back: Normal range of motion.  Neurological:  General: No focal deficit present.  Mental Status: He is alert and oriented to person, place, and time. Mental status is at baseline.  Psychiatric:  Mood and Affect: Mood normal.  Behavior: Behavior normal.  Thought Content: Thought content normal.    Labs, Imaging and Diagnostic Testing:  I reviewed CT scan images showing a 2 cm supraumbilical hernia and 2 cm umbilical hernia as well as bilateral fat containing inguinal hernias.  Assessment and Plan:   Diagnoses and all orders for this visit:  Umbilical hernia without obstruction or gangrene  Morbid (severe) obesity due to excess calories (CMS-HCC)    The patient has a symptomatic reducible hernia. We discussed the etiology of his hernia, the risk of it enlarging, incarceration, obstruction, strangulation, and that it is unlikely to get smaller or better on its own. We discussed operative options of laparoscopic vs  open repair with mesh including the risks of recurrence, injury to intestines or abdominal organs, or chronic pain associated with mesh. We decided to proceed with robotic umbilical hernia repair with mesh as outpatient.

## 2022-09-06 ENCOUNTER — Other Ambulatory Visit: Payer: Self-pay

## 2022-09-06 ENCOUNTER — Emergency Department (HOSPITAL_BASED_OUTPATIENT_CLINIC_OR_DEPARTMENT_OTHER): Payer: 59

## 2022-09-06 ENCOUNTER — Emergency Department (HOSPITAL_BASED_OUTPATIENT_CLINIC_OR_DEPARTMENT_OTHER)
Admission: EM | Admit: 2022-09-06 | Discharge: 2022-09-06 | Disposition: A | Discharge status: Home / Self Care | Payer: 59 | Attending: Emergency Medicine | Admitting: Emergency Medicine

## 2022-09-06 ENCOUNTER — Emergency Department (HOSPITAL_BASED_OUTPATIENT_CLINIC_OR_DEPARTMENT_OTHER): Payer: 59 | Admitting: Radiology

## 2022-09-06 ENCOUNTER — Encounter (HOSPITAL_BASED_OUTPATIENT_CLINIC_OR_DEPARTMENT_OTHER): Payer: Self-pay | Admitting: Emergency Medicine

## 2022-09-06 DIAGNOSIS — R079 Chest pain, unspecified: Secondary | ICD-10-CM

## 2022-09-06 DIAGNOSIS — E669 Obesity, unspecified: Secondary | ICD-10-CM | POA: Insufficient documentation

## 2022-09-06 DIAGNOSIS — Z6841 Body Mass Index (BMI) 40.0 and over, adult: Secondary | ICD-10-CM | POA: Diagnosis not present

## 2022-09-06 DIAGNOSIS — J45909 Unspecified asthma, uncomplicated: Secondary | ICD-10-CM | POA: Insufficient documentation

## 2022-09-06 DIAGNOSIS — R0789 Other chest pain: Secondary | ICD-10-CM | POA: Diagnosis present

## 2022-09-06 LAB — BASIC METABOLIC PANEL
Anion gap: 10 (ref 5–15)
BUN: 10 mg/dL (ref 6–20)
CO2: 26 mmol/L (ref 22–32)
Calcium: 9.5 mg/dL (ref 8.9–10.3)
Chloride: 102 mmol/L (ref 98–111)
Creatinine, Ser: 0.95 mg/dL (ref 0.61–1.24)
GFR, Estimated: >60 mL/min (ref >60)
Glucose, Bld: 108 mg/dL — ABNORMAL HIGH (ref 70–99)
Potassium: 4.1 mmol/L (ref 3.5–5.1)
Sodium: 138 mmol/L (ref 135–145)

## 2022-09-06 LAB — CBC
HCT: 45.8 % (ref 39.0–52.0)
Hemoglobin: 15.4 g/dL (ref 13.0–17.0)
MCH: 28.8 pg (ref 26.0–34.0)
MCHC: 33.6 g/dL (ref 30.0–36.0)
MCV: 85.6 fL (ref 80.0–100.0)
Platelets: 309 10*3/uL (ref 150–400)
RBC: 5.35 MIL/uL (ref 4.22–5.81)
RDW: 13.5 % (ref 11.5–15.5)
WBC: 6.2 10*3/uL (ref 4.0–10.5)
nRBC: 0 % (ref 0.0–0.2)

## 2022-09-06 LAB — TROPONIN I (HIGH SENSITIVITY)
Troponin I (High Sensitivity): <2 ng/L (ref <18)
Troponin I (High Sensitivity): <2 ng/L (ref <18)

## 2022-09-06 LAB — D-DIMER, QUANTITATIVE: D-Dimer, Quant: 1.7 ug/mL-FEU — ABNORMAL HIGH (ref 0.00–0.50)

## 2022-09-06 MED ORDER — IOHEXOL 350 MG/ML SOLN
100.0000 mL | Freq: Once | INTRAVENOUS | Status: AC | PRN
  Administered 2022-09-06: 100 mL via INTRAVENOUS

## 2022-09-06 NOTE — ED Notes (Signed)
RN reviewed discharge instructions with pt. Pt verbalized understanding and had no further questions. VSS upon discharge.  

## 2022-09-06 NOTE — Discharge Instructions (Addendum)
You were seen in the ER today for evaluation of your chest pain and shortness of breath.  Your lab work was unremarkable other than an elevated D-dimer.  Your CT scan does not show any large pulmonary embolism.  If you have continued chest pain or shortness of breath or any worsening symptoms, please return to the nearest emergency department for reevaluation.  I have sent in referral for ambulatory cardiology referral.  If they do not reach out to the next few days, please make sure to call them to schedule an appointment.  If you have any concerns, new or worsening symptoms, please return to the nearest emergency department for evaluation.  Contact a doctor if: Your chest pain does not go away. You feel depressed. You have a fever. Get help right away if: Your chest pain is worse. You have a cough that gets worse, or you cough up blood. You have very bad (severe) pain in your belly (abdomen). You pass out (faint). You have either of these for no clear reason: Sudden chest discomfort. Sudden discomfort in your arms, back, neck, or jaw. You have shortness of breath at any time. You suddenly start to sweat, or your skin gets clammy. You feel sick to your stomach (nauseous). You throw up (vomit). You suddenly feel lightheaded or dizzy. You feel very weak or tired. Your heart starts to beat fast, or it feels like it is skipping beats. These symptoms may be an emergency. Do not wait to see if the symptoms will go away. Get medical help right away. Call your local emergency services (911 in the [place] Do not drive yourself to the hospital.

## 2022-09-06 NOTE — ED Provider Notes (Signed)
[place] EMERGENCY DEPARTMENT AT [facility] Provider Note   CSN: [id number] Arrival date & time: 09/06/22  1029     History {Add pertinent medical, surgical, social history, OB history to HPI:1} Chief Complaint  Patient presents with   Chest Pain    [place] is a 32 y.o. male.   Chest Pain      Home Medications Prior to Admission medications   Medication Sig Start Date End Date Taking? Authorizing Provider  albuterol (PROVENTIL HFA;VENTOLIN HFA) 108 (90 BASE) MCG/ACT inhaler Inhale 1-2 puffs into the lungs every 6 (six) hours as needed for wheezing or shortness of breath.     [provider]  ibuprofen (ADVIL) 800 MG tablet Take 1 tablet (800 mg total) by mouth every 8 (eight) hours as needed. 08/19/22   Kinsinger, De Blanch, MD  loratadine (CLARITIN) 10 MG tablet Take 10 mg by mouth daily. 07/30/21   [provider]  Menthol, Topical Analgesic, (ICY HOT EX) Apply 1 Application topically daily as needed (pain).    [provider]  oxyCODONE (OXY IR/ROXICODONE) 5 MG immediate release tablet Take 1 tablet (5 mg total) by mouth every 6 (six) hours as needed for severe pain. 08/19/22   Kinsinger, De Blanch, MD  SYMBICORT 160-4.5 MCG/ACT inhaler Inhale 2 puffs into the lungs daily. 07/30/21   [provider]      Allergies    Patient has no known allergies.    Review of Systems   Review of Systems  Cardiovascular:  Positive for chest pain.    Physical Exam Updated Vital Signs BP 130/83   Pulse 76   Temp 98 F (36.7 C) (Oral)   Resp 12   Ht 6\' 3"  (1.905 m)   Wt (!) 172.4 kg   SpO2 100%   BMI 47.50 kg/m  Physical Exam  ED Results / Procedures / Treatments   Labs (all labs ordered are listed, but only abnormal results are displayed) Labs Reviewed  BASIC METABOLIC PANEL - Abnormal; Notable for the following components:      Result Value   Glucose, Bld 108 (*)    All other components within normal limits  D-DIMER,  QUANTITATIVE - Abnormal; Notable for the following components:   D-Dimer, Quant 1.70 (*)    All other components within normal limits  CBC  TROPONIN I (HIGH SENSITIVITY)  TROPONIN I (HIGH SENSITIVITY)    EKG EKG Interpretation  Date/Time:  Tuesday Sep 06 2022 10:59:26 EDT Ventricular Rate:  83 PR Interval:  164 QRS Duration: 95 QT Interval:  343 QTC Calculation: 403 R Axis:   81 Text Interpretation: Sinus rhythm Anteroseptal infarct, old Borderline repolarization abnormality Similar TW inversion inferior leads Confirmed by Alvira Monday ([id number]) on 09/06/2022 2:58:52 PM  Radiology CT Angio Chest PE W and/or Wo Contrast  Result Date: 09/06/2022 CLINICAL DATA:  Pulmonary embolism suspected, low to intermediate probability, positive D-dimer. EXAM: CT ANGIOGRAPHY CHEST WITH CONTRAST TECHNIQUE: Multidetector CT imaging of the chest was performed using the standard protocol during bolus administration of intravenous contrast. Multiplanar CT image reconstructions and MIPs were obtained to evaluate the vascular anatomy. RADIATION DOSE REDUCTION: This exam was performed according to the departmental dose-optimization program which includes automated exposure control, adjustment of the mA and/or kV according to patient size and/or use of iterative reconstruction technique. CONTRAST:  OMNIPAQUE IOHEXOL 350 MG/ML SOLN COMPARISON:  Chest radiograph 09/06/2022 FINDINGS: Cardiovascular: Heart size is within normal limits. Limited evaluation for pulmonary embolism due to poor opacification  beyond the lobar pulmonary arteries. No large filling defects in the main or lobar pulmonary arteries. There is contrast in the thoracic aorta. No gross abnormality to the thoracic aorta. Mediastinum/Nodes: Visualized thyroid tissue is unremarkable. No mediastinal or hilar lymph node enlargement. No axillary lymph node enlargement. Lungs/Pleura: Trachea and mainstem bronchi are patent. No pleural effusions. No  significant airspace disease or lung consolidation. Upper Abdomen: Images of the upper abdomen are unremarkable. Musculoskeletal: No acute bone abnormality. Review of the MIP images confirms the above findings. IMPRESSION: 1. Limited evaluation for pulmonary embolism due to poor opacification beyond the lobar pulmonary arteries. No large filling defects in the main or lobar pulmonary arteries. 2. No acute chest abnormality. Electronically Signed   By: Richarda Overlie M.D.   On: 09/06/2022 13:11   DG Chest 2 View  Result Date: 09/06/2022 CLINICAL DATA:  Shortness of breath, chest pain. EXAM: CHEST - 2 VIEW COMPARISON:  07/28/2021. FINDINGS: Clear lungs. Normal heart size and mediastinal contours. No pleural effusion or pneumothorax. Visualized bones and upper abdomen are unremarkable. IMPRESSION: No evidence of acute cardiopulmonary disease. Electronically Signed   By: Orvan Falconer M.D.   On: 09/06/2022 11:28    Procedures Procedures  {Document cardiac monitor, telemetry assessment procedure when appropriate:1}  Medications Ordered in ED Medications  iohexol (OMNIPAQUE) 350 MG/ML injection 100 mL (100 mLs Intravenous Contrast Given 09/06/22 1246)    ED Course/ Medical Decision Making/ A&P   {   Click here for ABCD2, HEART and other calculatorsREFRESH Note before signing :1}                          Medical Decision Making Amount and/or Complexity of Data Reviewed Labs: ordered. Radiology: ordered.  Risk Prescription drug management.   ***  {Document critical care time when appropriate:1} {Document review of labs and clinical decision tools ie heart score, Chads2Vasc2 etc:1}  {Document your independent review of radiology images, and any outside records:1} {Document your discussion with family members, caretakers, and with consultants:1} {Document social determinants of health affecting pt's care:1} {Document your decision making why or why not admission, treatments were  needed:1} Final Clinical Impression(s) / ED Diagnoses Final diagnoses:  None    Rx / DC Orders ED Discharge Orders     None

## 2022-09-06 NOTE — ED Notes (Signed)
Called lab to run d-dimer, spoke with Marchelle Folks.

## 2022-09-06 NOTE — ED Triage Notes (Signed)
Pt arrives to ED with c/o chest pain associated SOB that started this morning.

## 2022-09-22 ENCOUNTER — Emergency Department (HOSPITAL_BASED_OUTPATIENT_CLINIC_OR_DEPARTMENT_OTHER)
Admission: EM | Admit: 2022-09-22 | Discharge: 2022-09-22 | Disposition: A | Discharge status: Home / Self Care | Payer: 59 | Attending: Emergency Medicine | Admitting: Emergency Medicine

## 2022-09-22 ENCOUNTER — Other Ambulatory Visit: Payer: Self-pay

## 2022-09-22 ENCOUNTER — Encounter (HOSPITAL_BASED_OUTPATIENT_CLINIC_OR_DEPARTMENT_OTHER): Payer: Self-pay

## 2022-09-22 ENCOUNTER — Emergency Department (HOSPITAL_BASED_OUTPATIENT_CLINIC_OR_DEPARTMENT_OTHER): Payer: 59

## 2022-09-22 DIAGNOSIS — I10 Essential (primary) hypertension: Secondary | ICD-10-CM | POA: Insufficient documentation

## 2022-09-22 DIAGNOSIS — R079 Chest pain, unspecified: Secondary | ICD-10-CM

## 2022-09-22 LAB — CBC
HCT: 43.9 % (ref 39.0–52.0)
Hemoglobin: 14.7 g/dL (ref 13.0–17.0)
MCH: 28.3 pg (ref 26.0–34.0)
MCHC: 33.5 g/dL (ref 30.0–36.0)
MCV: 84.6 fL (ref 80.0–100.0)
Platelets: 231 K/uL (ref 150–400)
RBC: 5.19 MIL/uL (ref 4.22–5.81)
RDW: 13.6 % (ref 11.5–15.5)
WBC: 5.7 K/uL (ref 4.0–10.5)
nRBC: 0 % (ref 0.0–0.2)

## 2022-09-22 LAB — TROPONIN I (HIGH SENSITIVITY)
Troponin I (High Sensitivity): 2 ng/L
Troponin I (High Sensitivity): 2 ng/L

## 2022-09-22 LAB — BASIC METABOLIC PANEL
Anion gap: 8 (ref 5–15)
BUN: 17 mg/dL (ref 6–20)
CO2: 26 mmol/L (ref 22–32)
Calcium: 8.8 mg/dL — ABNORMAL LOW (ref 8.9–10.3)
Chloride: 104 mmol/L (ref 98–111)
Creatinine, Ser: 0.96 mg/dL (ref 0.61–1.24)
GFR, Estimated: >60 mL/min (ref >60)
Glucose, Bld: 90 mg/dL (ref 70–99)
Potassium: 4 mmol/L (ref 3.5–5.1)
Sodium: 138 mmol/L (ref 135–145)

## 2022-09-22 NOTE — Discharge Instructions (Addendum)
Your blood pressure has normalized here in the Emergency Department to 127/82 Your labs in the past month show normal kidney function and electrolytes, blood sugar, and blood counts. Please continue bp log for your primary doctor. If you have worsening symptoms such as chest pain, shortness of breath, or headache please return for reevaluation.

## 2022-09-22 NOTE — ED Triage Notes (Signed)
States had BP checked at work it was 180/130 and told to come to ER.    Slight headache

## 2022-09-22 NOTE — ED Provider Notes (Incomplete)
[place] EMERGENCY DEPARTMENT AT [facility] Provider Note   CSN: [id number] Arrival date & time: 09/22/22  1236     History {Add pertinent medical, surgical, social history, OB history to HPI:1} Chief Complaint  Patient presents with  . Hypertension    Jose Chambers is a 32 y.o. male.  HPI 32 yo male presents for high blood pressure. He was seen last month for atypical chest pain.  Today he had his bp checked at FD today at had two measurements of 180/130.  His supervisor pulled and sent to ED. Patient reports he had a 30 second episode of sharp anterior chest pain like prior evaluation.  This occurred about 1045.    Home Medications Prior to Admission medications   Medication Sig Start Date End Date Taking? Authorizing Provider  albuterol (PROVENTIL HFA;VENTOLIN HFA) 108 (90 BASE) MCG/ACT inhaler Inhale 1-2 puffs into the lungs every 6 (six) hours as needed for wheezing or shortness of breath.     [provider]  ibuprofen (ADVIL) 800 MG tablet Take 1 tablet (800 mg total) by mouth every 8 (eight) hours as needed. 08/19/22   Kinsinger, De Blanch, MD  loratadine (CLARITIN) 10 MG tablet Take 10 mg by mouth daily. 07/30/21   [provider]  Menthol, Topical Analgesic, (ICY HOT EX) Apply 1 Application topically daily as needed (pain).    [provider]  oxyCODONE (OXY IR/ROXICODONE) 5 MG immediate release tablet Take 1 tablet (5 mg total) by mouth every 6 (six) hours as needed for severe pain. 08/19/22   Kinsinger, De Blanch, MD  SYMBICORT 160-4.5 MCG/ACT inhaler Inhale 2 puffs into the lungs daily. 07/30/21   [provider]      Allergies    Patient has no known allergies.    Review of Systems   Review of Systems  Physical Exam Updated Vital Signs BP (!) 138/92 (BP Location: Right Arm)   Pulse 86   Temp 98.6 F (37 C) (Oral)   Resp 18   Ht 1.905 m (6\' 3" )   Wt (!) 174.6 kg   SpO2 96%   BMI 48.12 kg/m  Physical Exam Vitals  and nursing note reviewed.  HENT:     Head: Normocephalic.     Right Ear: External ear normal.     Left Ear: External ear normal.     Nose: Nose normal.     Mouth/Throat:     Pharynx: Oropharynx is clear.  Eyes:     Pupils: Pupils are equal, round, and reactive to light.  Cardiovascular:     Rate and Rhythm: Normal rate and regular rhythm.     Pulses: Normal pulses.  Pulmonary:     Effort: Pulmonary effort is normal.     Breath sounds: Normal breath sounds.  Abdominal:     General: Abdomen is flat.     Palpations: Abdomen is soft.  Musculoskeletal:        General: Normal range of motion.     Cervical back: Normal range of motion.  Skin:    General: Skin is warm and dry.     Capillary Refill: Capillary refill takes less than 2 seconds.  Neurological:     General: No focal deficit present.     Mental Status: He is alert.  Psychiatric:        Mood and Affect: Mood normal.     ED Results / Procedures / Treatments   Labs (all labs ordered are listed, but only abnormal results are  displayed) Labs Reviewed - No data to display  EKG None  Radiology No results found.  Procedures Procedures  {Document cardiac monitor, telemetry assessment procedure when appropriate:1}  Medications Ordered in ED Medications - No data to display  ED Course/ Medical Decision Making/ A&P Clinical Course as of 09/22/22 1406  Thu Sep 22, 2022  1405 Chest x-Deniel Mcquiston reviewed and interpreted and no acute abnormality noted on my interpretation [DR]  1405 CBC reviewed interpreted within normal limits Basic metabolic panel reviewed and interpreted and significant for hypocalcemia otherwise within normal limits [DR]    Clinical Course User Index [DR] Margarita Grizzle, MD   {   Click here for ABCD2, HEART and other calculatorsREFRESH Note before signing :1}                          Medical Decision Making Amount and/or Complexity of Data Reviewed Labs: ordered. Radiology: ordered.   BP was was  elevated at sbp 180 at FD Now 127/82 Reviewed labs from last visit and normal renal function, bs, electrolytes and EKG without acute changes Patient followed at [facility] and reports bp have been followed and sbps 140 Dr. Thornell Mule has been monitoring.  {Document critical care time when appropriate:1} {Document review of labs and clinical decision tools ie heart score, Chads2Vasc2 etc:1}  {Document your independent review of radiology images, and any outside records:1} {Document your discussion with family members, caretakers, and with consultants:1} {Document social determinants of health affecting pt's care:1} {Document your decision making why or why not admission, treatments were needed:1} Final Clinical Impression(s) / ED Diagnoses Final diagnoses:  None    Rx / DC Orders ED Discharge Orders     None

## 2022-09-22 NOTE — ED Provider Notes (Signed)
Jose Chambers   CSN: [id number] Arrival date & time: 09/22/22  1236     History  Chief Complaint  Patient presents with   Hypertension    Jose Chambers is a 32 y.o. male.  HPI 32 yo male presents for high blood pressure. He was seen last month for atypical chest pain.  Today he had his bp checked at FD today at had two measurements of 180/130.  His supervisor pulled and sent to ED. Patient reports he had a 30 second episode of sharp anterior chest pain like prior evaluation.  This occurred about 1045.    Home Medications Prior to Admission medications   Medication Sig Start Date End Date Taking? Authorizing Provider  albuterol (PROVENTIL HFA;VENTOLIN HFA) 108 (90 BASE) MCG/ACT inhaler Inhale 1-2 puffs into the lungs every 6 (six) hours as needed for wheezing or shortness of breath.     [provider]  ibuprofen (ADVIL) 800 MG tablet Take 1 tablet (800 mg total) by mouth every 8 (eight) hours as needed. 08/19/22   Kinsinger, De Blanch, MD  loratadine (CLARITIN) 10 MG tablet Take 10 mg by mouth daily. 07/30/21   [provider]  Menthol, Topical Analgesic, (ICY HOT EX) Apply 1 Application topically daily as needed (pain).    [provider]  oxyCODONE (OXY IR/ROXICODONE) 5 MG immediate release tablet Take 1 tablet (5 mg total) by mouth every 6 (six) hours as needed for severe pain. 08/19/22   Kinsinger, De Blanch, MD  SYMBICORT 160-4.5 MCG/ACT inhaler Inhale 2 puffs into the lungs daily. 07/30/21   [provider]      Allergies    Patient has no known allergies.    Review of Systems   Review of Systems  Physical Exam Updated Vital Signs BP 125/70   Pulse 84   Temp 98.6 F (37 C) (Oral)   Resp (!) 22   Ht 1.905 m (6\' 3" )   Wt (!) 174.6 kg   SpO2 100%   BMI 48.12 kg/m  Physical Exam Vitals and nursing Chambers reviewed.  HENT:     Head: Normocephalic.     Right Ear: External ear  normal.     Left Ear: External ear normal.     Nose: Nose normal.     Mouth/Throat:     Pharynx: Oropharynx is clear.  Eyes:     Pupils: Pupils are equal, round, and reactive to light.  Cardiovascular:     Rate and Rhythm: Normal rate and regular rhythm.     Pulses: Normal pulses.  Pulmonary:     Effort: Pulmonary effort is normal.     Breath sounds: Normal breath sounds.  Abdominal:     General: Abdomen is flat.     Palpations: Abdomen is soft.  Musculoskeletal:        General: Normal range of motion.     Cervical back: Normal range of motion.  Skin:    General: Skin is warm and dry.     Capillary Refill: Capillary refill takes less than 2 seconds.  Neurological:     General: No focal deficit present.     Mental Status: He is alert.  Psychiatric:        Mood and Affect: Mood normal.     ED Results / Procedures / Treatments   Labs (all labs ordered are listed, but only abnormal results are displayed) Labs Reviewed  BASIC METABOLIC PANEL - Abnormal; Notable for the following  components:      Result Value   Calcium 8.8 (*)    All other components within normal limits  CBC  TROPONIN I (HIGH SENSITIVITY)  TROPONIN I (HIGH SENSITIVITY)    EKG EKG Interpretation  Date/Time:  Thursday September 22 2022 13:30:03 EDT Ventricular Rate:  82 PR Interval:  169 QRS Duration: 96 QT Interval:  357 QTC Calculation: 417 R Axis:   67 Text Interpretation: Sinus rhythm Normal ECG Confirmed by Margarita Grizzle [phone]) on 09/22/2022 1:52:03 PM  Radiology No results found.  Procedures Procedures    Medications Ordered in ED Medications - No data to display  ED Course/ Medical Decision Making/ A&P Clinical Course as of 09/26/22 1057  Thu Sep 22, 2022  1405 Chest x-Charisma Charlot reviewed and interpreted and no acute abnormality noted on my interpretation [DR]  1405 CBC reviewed interpreted within normal limits Basic metabolic panel reviewed and interpreted and significant for hypocalcemia  otherwise within normal limits [DR]    Clinical Course User Index [DR] Margarita Grizzle, MD                             Medical Decision Making Amount and/or Complexity of Data Reviewed Labs: ordered. Radiology: ordered.   BP was was elevated at sbp 180 at FD Now 127/82 Reviewed labs from last visit and normal renal function, bs, electrolytes and EKG without acute changes Patient followed at [facility] and reports bp have been followed and sbps 140 Dr. Thornell Mule has been monitoring. 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 Patient's primary care doctor has been following. Reevaluated for renal function which continues normal here.  Given patient's primary care follow-up and blood pressure is down to 114 here, would not institute acute intervention We have discussed lifestyle interventions and need for close follow-up with his primary care doctor 2 patient has had some atypical chest pain.  He had 30 seconds of chest pain earlier today.  He is evaluated here with repeat EKG which does not show any acute ischemia and troponins. Heart score is 1- repeat troponin is pending Patient seen and evaluated for chest pain.  Differential diagnosis of serious/life threatening causes of chest pain includes ACS, other diseases of the heart such as myocarditis or pericarditis, lung etiologies such as infection or pneumothorax, diseases of the great vessels such as aortic dissection or AAA, pulmonary embolism, or GI sources such as cholecystitis or other upper abdominal causes. Doubt ACS- heart score documented, EKG reviewed, Given the timing of pain to ER presentation,  delta troponin 2, repeat pending so doubt NSTEMI troponin and repeat troponin obtained and WNL Doubt myocarditis/pericarditis/tamponade based on history, review of ekg and labs Doubt aortic dissection based on history and review  of imaging Doubt intrinsic lung causes such as pneumonia or pneumothorax, based on history, physical exam, and studies obtained. Doubt PE based on history, physical exam, and PERC Doubt acute GI etiology requiring intervention based on history, physical exam and labs. Patient appears stable for discharge. Return precautions and need for follow up discussed and patient voices understanding         Final Clinical Impression(s) / ED Diagnoses Final diagnoses:  Hypertension, unspecified type  Chest pain, unspecified type    Rx / DC Orders ED Discharge Orders     None         Margarita Grizzle, MD  09/26/22 1057  

## 2022-09-22 NOTE — ED Provider Notes (Signed)
F/u troponin, D/C if normal Physical Exam  BP (!) 138/92 (BP Location: Right Arm)   Pulse 86   Temp 98.6 F (37 C) (Oral)   Resp 18   Ht 6\' 3"  (1.905 m)   Wt (!) 174.6 kg   SpO2 96%   BMI 48.12 kg/m   Physical Exam  Procedures  Procedures  ED Course / MDM   Clinical Course as of 09/22/22 1535  Thu Sep 22, 2022  1405 Chest x-ray reviewed and interpreted and no acute abnormality noted on my interpretation [DR]  1405 CBC reviewed interpreted within normal limits Basic metabolic panel reviewed and interpreted and significant for hypocalcemia otherwise within normal limits [DR]    Clinical Course User Index [DR] Margarita Grizzle, MD   Medical Decision Making Amount and/or Complexity of Data Reviewed Labs: ordered. Radiology: ordered.   Troponin is normal.       Arby Barrette, MD 09/22/22 [phone]

## 2022-09-23 LAB — HEMOGLOBIN A1C: Hemoglobin A1C: 5.7

## 2022-09-23 LAB — AMB RESULTS CONSOLE CBG: Glucose: 95

## 2022-10-17 ENCOUNTER — Ambulatory Visit: Payer: 59 | Attending: Internal Medicine | Admitting: Internal Medicine

## 2022-10-17 VITALS — BP 102/64 | HR 76 | Ht 75.0 in | Wt 394.0 lb

## 2022-10-17 DIAGNOSIS — R079 Chest pain, unspecified: Secondary | ICD-10-CM | POA: Diagnosis not present

## 2022-10-17 NOTE — Patient Instructions (Addendum)
Medication Instructions:  Your physician recommends that you continue on your current medications as directed. Please refer to the Current Medication list given to you today.  *If you need a refill on your cardiac medications before your next appointment, please call your pharmacy*   Lab Work: None   Testing/Procedures: None   Follow-Up: At [facility], you and your health needs are our priority.  As part of our continuing mission to provide you with exceptional heart care, we have created designated Provider Care Teams.  These Care Teams include your primary Cardiologist (physician) and Advanced Practice Providers (APPs -  Physician Assistants and Nurse Practitioners) who all work together to provide you with the care you need, when you need it.   Your next appointment:    As needed  Provider:   Branch, Mary E, MD    
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
## 2022-10-25 ENCOUNTER — Encounter: Payer: Self-pay | Admitting: *Deleted

## 2022-10-25 NOTE — Progress Notes (Signed)
Pt seen at 09/23/22 screening event where his b/p was 122/87 and his blood sugar was 95 and his A1C was 5.7. Pt did not identify any SDOH insecurities at the event. Chart review indicates pt has a PCP, Dr. Charlane Ferretti at [facility]. Although his PCP's documentation does not occur in [facility], referral notes indicate pt was last seen by Dr. Thornell Mule on 09/28/22 where pt had physical exam as well as labs. No additional health equity team support indicated at this time.

## 2023-12-08 ENCOUNTER — Other Ambulatory Visit: Payer: Self-pay

## 2023-12-08 ENCOUNTER — Emergency Department (HOSPITAL_COMMUNITY): Admission: EM | Admit: 2023-12-08 | Discharge: 2023-12-08 | Disposition: A | Discharge status: Home / Self Care

## 2023-12-08 ENCOUNTER — Emergency Department (HOSPITAL_COMMUNITY)

## 2023-12-08 DIAGNOSIS — Z79899 Other long term (current) drug therapy: Secondary | ICD-10-CM | POA: Diagnosis not present

## 2023-12-08 DIAGNOSIS — I10 Essential (primary) hypertension: Secondary | ICD-10-CM | POA: Diagnosis not present

## 2023-12-08 DIAGNOSIS — R0789 Other chest pain: Secondary | ICD-10-CM | POA: Diagnosis present

## 2023-12-08 DIAGNOSIS — J45901 Unspecified asthma with (acute) exacerbation: Secondary | ICD-10-CM | POA: Diagnosis not present

## 2023-12-08 LAB — CBC
HCT: 47.8 % (ref 39.0–52.0)
Hemoglobin: 15.4 g/dL (ref 13.0–17.0)
MCH: 27.9 pg (ref 26.0–34.0)
MCHC: 32.2 g/dL (ref 30.0–36.0)
MCV: 86.8 fL (ref 80.0–100.0)
Platelets: 273 K/uL (ref 150–400)
RBC: 5.51 MIL/uL (ref 4.22–5.81)
RDW: 13.7 % (ref 11.5–15.5)
WBC: 4.7 K/uL (ref 4.0–10.5)
nRBC: 0 % (ref 0.0–0.2)

## 2023-12-08 LAB — BASIC METABOLIC PANEL WITH GFR
Anion gap: 12 (ref 5–15)
BUN: 11 mg/dL (ref 6–20)
CO2: 24 mmol/L (ref 22–32)
Calcium: 9.5 mg/dL (ref 8.9–10.3)
Chloride: 103 mmol/L (ref 98–111)
Creatinine, Ser: 0.75 mg/dL (ref 0.61–1.24)
GFR, Estimated: >60 mL/min (ref >60)
Glucose, Bld: 92 mg/dL (ref 70–99)
Potassium: 4 mmol/L (ref 3.5–5.1)
Sodium: 139 mmol/L (ref 135–145)

## 2023-12-08 LAB — TROPONIN I (HIGH SENSITIVITY)
Troponin I (High Sensitivity): <2 ng/L (ref <18)
Troponin I (High Sensitivity): <2 ng/L (ref <18)

## 2023-12-08 MED ORDER — PREDNISONE 20 MG PO TABS
60.0000 mg | ORAL_TABLET | Freq: Once | ORAL | Status: AC
  Administered 2023-12-08: 60 mg via ORAL
  Filled 2023-12-08: qty 3

## 2023-12-08 MED ORDER — PREDNISONE 50 MG PO TABS
ORAL_TABLET | ORAL | 0 refills | Status: AC
Start: 2023-12-08 — End: ?

## 2023-12-08 MED ORDER — ALBUTEROL SULFATE HFA 108 (90 BASE) MCG/ACT IN AERS
2.0000 | INHALATION_SPRAY | RESPIRATORY_TRACT | 0 refills | Status: AC | PRN
Start: 2023-12-08 — End: ?

## 2023-12-08 MED ORDER — IPRATROPIUM-ALBUTEROL 0.5-2.5 (3) MG/3ML IN SOLN
3.0000 mL | Freq: Once | RESPIRATORY_TRACT | Status: AC
  Administered 2023-12-08: 3 mL via RESPIRATORY_TRACT
  Filled 2023-12-08: qty 3

## 2023-12-08 NOTE — ED Provider Notes (Signed)
 [place] EMERGENCY DEPARTMENT AT [facility] Provider Note   CSN: [id number] Arrival date & time: 12/08/23  1337     Patient presents with: Chest Pain   Jose Chambers is a 33 y.o. male.   33 year old male with past medical history of hypertension and asthma presenting to the emergency department today with left-sided chest pressure.  The patient states that he has been having some left-sided chest pressure that started earlier today.  Reports the pain is more of a pressure sensation on the left side of his chest.  Feels that he cannot get a good deep breath then.  He states he does have a history of asthma.  He did try his inhaler which did not really seem to help much.  He denies any focal weakness, numbness, or tingling.  Denies any sharp pain with this.  He states the pain is nonpleuritic.  Denies any lower extremity swelling.  He came to the ER today for further evaluation regarding this.   Chest Pain      Prior to Admission medications   Medication Sig Start Date End Date Taking? Authorizing Provider  albuterol  (VENTOLIN  HFA) 108 (90 Base) MCG/ACT inhaler Inhale 2 puffs into the lungs every 4 (four) hours as needed for wheezing or shortness of breath. 12/08/23  Yes Ula Prentice SAUNDERS, MD  predniSONE  (DELTASONE ) 50 MG tablet Take 1 tablet my mouth daily 12/08/23  Yes Ula Prentice SAUNDERS, MD  albuterol  (PROVENTIL  HFA;VENTOLIN  HFA) 108 (90 BASE) MCG/ACT inhaler Inhale 1-2 puffs into the lungs every 6 (six) hours as needed for wheezing or shortness of breath.     [provider]  amLODipine (NORVASC) 5 MG tablet Take 5 mg by mouth daily. 09/28/22   [provider]  ibuprofen  (ADVIL ) 800 MG tablet Take 1 tablet (800 mg total) by mouth every 8 (eight) hours as needed. Patient not taking: Reported on 10/17/2022 08/19/22   Kinsinger, Herlene Righter, MD  loratadine (CLARITIN) 10 MG tablet Take 10 mg by mouth daily. 07/30/21   [provider]  Menthol, Topical Analgesic, (ICY  HOT EX) Apply 1 Application topically daily as needed (pain). Patient not taking: Reported on 10/17/2022    [provider]  montelukast (SINGULAIR) 10 MG tablet Take 10 mg by mouth daily. 09/28/22   [provider]  oxyCODONE  (OXY IR/ROXICODONE ) 5 MG immediate release tablet Take 1 tablet (5 mg total) by mouth every 6 (six) hours as needed for severe pain. Patient not taking: Reported on 10/17/2022 08/19/22   Kinsinger, Herlene Righter, MD  SYMBICORT 160-4.5 MCG/ACT inhaler Inhale 2 puffs into the lungs daily. 07/30/21   [provider]    Allergies: Watermelon flavoring agent (non-screening)    Review of Systems  Cardiovascular:  Positive for chest pain.  All other systems reviewed and are negative.   Updated Vital Signs BP (!) 138/94 (BP Location: Left Arm)   Pulse 78   Temp 97.8 F (36.6 C) (Oral)   Resp 19   Ht 6' 3 (1.905 m)   Wt (!) 172.4 kg   SpO2 98%   BMI 47.50 kg/m   Physical Exam Vitals and nursing note reviewed.   Gen: NAD Eyes: PERRL, EOMI HEENT: no oropharyngeal swelling Neck: trachea midline Resp: clear to auscultation bilaterally although diminished at bilateral lung bases Card: RRR, no murmurs, rubs, or gallops Abd: nontender, nondistended Extremities: no calf tenderness, no edema Vascular: 2+ radial pulses bilaterally, 2+ DP pulses bilaterally Neuro: No focal deficits Skin: no rashes  Psyc: acting appropriately   (all labs ordered are listed, but only abnormal results are displayed) Labs Reviewed  BASIC METABOLIC PANEL WITH GFR  CBC  TROPONIN I (HIGH SENSITIVITY)  TROPONIN I (HIGH SENSITIVITY)    EKG: EKG Interpretation Date/Time:  Friday December 08 2023 14:00:11 EDT Ventricular Rate:  72 PR Interval:  185 QRS Duration:  100 QT Interval:  357 QTC Calculation: 391 R Axis:   75  Text Interpretation: Sinus rhythm Borderline T abnormalities, inferior leads Baseline wander in lead(s) V1 Confirmed by Ula Barter [phone]) on  12/08/2023 4:28:48 PM  Radiology: DG Chest 2 View Result Date: 12/08/2023 CLINICAL DATA:  Shortness of breath. EXAM: CHEST - 2 VIEW COMPARISON:  09/22/2022. FINDINGS: Bilateral lung fields are clear. Bilateral costophrenic angles are clear. Normal cardio-mediastinal silhouette. No acute osseous abnormalities. The soft tissues are within normal limits. IMPRESSION: No active cardiopulmonary disease. Electronically Signed   By: Ree Molt M.D.   On: 12/08/2023 15:24     Procedures   Medications Ordered in the ED  predniSONE  (DELTASONE ) tablet 60 mg (has no administration in time range)  ipratropium-albuterol  (DUONEB) 0.5-2.5 (3) MG/3ML nebulizer solution 3 mL (3 mLs Nebulization Given 12/08/23 1454)                                    Medical Decision Making 33 year old male with past medical history of hypertension and asthma presenting to the emergency department today with left-sided chest pressure.  I will further evaluate the patient here with basic labs as well as an EKG, chest x-ray, troponin for further evaluation for ACS, pulmonary edema, pulmonary infiltrates, or pneumothorax.  The patient is PERC negative and suspicion for pulmonary embolism is low based on description of his symptoms.  Also doubt that this is due to aortic dissection given reassuring neurovascular exam.  The patient does have history of asthma and is slightly diminished here on exam.  Will give him a DuoNeb.  See if this helps the symptoms.  I will reevaluate for ultimate disposition.  The patient's work appears reassuring.  He is feeling much better after the breathing treatment.  The patient be started on prednisone .  He is discharged with return precautions.  Amount and/or Complexity of Data Reviewed Labs: ordered. Radiology: ordered.  Risk Prescription drug management.        Final diagnoses:  Asthma with acute exacerbation, unspecified asthma severity, unspecified whether persistent    ED  Discharge Orders          Ordered    predniSONE  (DELTASONE ) 50 MG tablet        12/08/23 1651    albuterol  (VENTOLIN  HFA) 108 (90 Base) MCG/ACT inhaler  Every 4 hours PRN        12/08/23 1651               Ula Barter SAUNDERS, MD 12/08/23 1652

## 2023-12-08 NOTE — Discharge Instructions (Signed)
Please follow up with your doctor and return to the ER for worsening symptoms.

## 2023-12-08 NOTE — ED Notes (Signed)
 Pt refused EKG informed the triage nurse

## 2023-12-08 NOTE — ED Triage Notes (Signed)
 Pt reports left upper chest tightness along with shortness of breath since Monday. Also endorses cough and tightness in throat.

## 2024-04-03 ENCOUNTER — Encounter: Payer: Self-pay | Admitting: Pulmonary Disease

## 2024-04-03 ENCOUNTER — Ambulatory Visit: Admitting: Pulmonary Disease

## 2024-04-03 VITALS — BP 120/76 | HR 84 | Temp 97.4°F | Ht 75.0 in | Wt 380.0 lb

## 2024-04-03 DIAGNOSIS — R400 Somnolence: Secondary | ICD-10-CM | POA: Diagnosis not present

## 2024-04-03 DIAGNOSIS — Z6841 Body Mass Index (BMI) 40.0 and over, adult: Secondary | ICD-10-CM | POA: Diagnosis not present
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
## 2024-04-03 NOTE — Patient Instructions (Signed)
 Schedule in-lab sleep study  Follow-up in 2 to 3 months  Encourage weight loss efforts  Call us  with significant concerns

## 2024-04-23 ENCOUNTER — Ambulatory Visit
Admission: RE | Admit: 2024-04-23 | Discharge: 2024-04-23 | Disposition: A | Discharge status: Home / Self Care | Source: Ambulatory Visit | Attending: Nurse Practitioner | Admitting: Nurse Practitioner

## 2024-04-23 DIAGNOSIS — S4992XA Unspecified injury of left shoulder and upper arm, initial encounter: Secondary | ICD-10-CM

## 2024-06-12 ENCOUNTER — Ambulatory Visit (HOSPITAL_BASED_OUTPATIENT_CLINIC_OR_DEPARTMENT_OTHER): Admitting: Pulmonary Disease

## 2024-07-02 ENCOUNTER — Ambulatory Visit: Admitting: Pulmonary Disease
